# Patient Record
Sex: Male | Born: 1970 | Race: White | Hispanic: No | Marital: Married | Smoking: Never smoker
Health system: Southern US, Community
[De-identification: ages and names within clinical notes are randomized; demographics above are authoritative.]

## PROBLEM LIST (undated history)

## (undated) DIAGNOSIS — K219 Gastro-esophageal reflux disease without esophagitis: Secondary | ICD-10-CM

## (undated) DIAGNOSIS — I429 Cardiomyopathy, unspecified: Secondary | ICD-10-CM

## (undated) DIAGNOSIS — K589 Irritable bowel syndrome without diarrhea: Secondary | ICD-10-CM

## (undated) DIAGNOSIS — T7840XA Allergy, unspecified, initial encounter: Secondary | ICD-10-CM

## (undated) HISTORY — PX: TONSILLECTOMY: SUR1361

## (undated) HISTORY — PX: UPPER GASTROINTESTINAL ENDOSCOPY: SHX188

## (undated) HISTORY — DX: Irritable bowel syndrome, unspecified: K58.9

## (undated) HISTORY — DX: Gastro-esophageal reflux disease without esophagitis: K21.9

## (undated) HISTORY — PX: COLONOSCOPY: SHX174

## (undated) HISTORY — DX: Cardiomyopathy, unspecified: I42.9

## (undated) HISTORY — DX: Allergy, unspecified, initial encounter: T78.40XA

---

## 2006-08-26 ENCOUNTER — Encounter: Payer: Self-pay | Admitting: Internal Medicine

## 2006-08-26 ENCOUNTER — Ambulatory Visit: Payer: Self-pay

## 2008-11-09 ENCOUNTER — Ambulatory Visit: Payer: Self-pay | Admitting: Internal Medicine

## 2008-11-09 DIAGNOSIS — R197 Diarrhea, unspecified: Secondary | ICD-10-CM

## 2008-11-09 DIAGNOSIS — K219 Gastro-esophageal reflux disease without esophagitis: Secondary | ICD-10-CM | POA: Insufficient documentation

## 2008-11-09 LAB — CONVERTED CEMR LAB
Albumin: 4.3 g/dL (ref 3.5–5.2)
Alkaline Phosphatase: 62 units/L (ref 39–117)
Basophils Absolute: 0 10*3/uL (ref 0.0–0.1)
Creatinine, Ser: 1 mg/dL (ref 0.4–1.5)
Eosinophils Absolute: 0.1 10*3/uL (ref 0.0–0.7)
HCT: 44.6 % (ref 39.0–52.0)
Hemoglobin: 14.9 g/dL (ref 13.0–17.0)
IgA: 139 mg/dL (ref 68–378)
Lymphs Abs: 1.8 10*3/uL (ref 0.7–4.0)
MCHC: 33.3 g/dL (ref 30.0–36.0)
Monocytes Absolute: 0.3 10*3/uL (ref 0.1–1.0)
Neutro Abs: 4.3 10*3/uL (ref 1.4–7.7)
RDW: 12.4 % (ref 11.5–14.6)

## 2009-02-20 ENCOUNTER — Telehealth: Payer: Self-pay | Admitting: Internal Medicine

## 2009-02-20 DIAGNOSIS — I4949 Other premature depolarization: Secondary | ICD-10-CM

## 2009-02-20 DIAGNOSIS — R9389 Abnormal findings on diagnostic imaging of other specified body structures: Secondary | ICD-10-CM

## 2009-02-21 ENCOUNTER — Encounter (INDEPENDENT_AMBULATORY_CARE_PROVIDER_SITE_OTHER): Payer: Self-pay | Admitting: *Deleted

## 2009-03-15 ENCOUNTER — Ambulatory Visit: Payer: Self-pay | Admitting: Cardiology

## 2009-03-15 ENCOUNTER — Ambulatory Visit (HOSPITAL_COMMUNITY): Admission: RE | Admit: 2009-03-15 | Discharge: 2009-03-15 | Payer: Self-pay | Admitting: Family Medicine

## 2009-03-16 ENCOUNTER — Ambulatory Visit: Payer: Self-pay | Admitting: Internal Medicine

## 2009-03-16 LAB — CONVERTED CEMR LAB
ALT: 20 units/L (ref 0–53)
AST: 21 units/L (ref 0–37)
Albumin: 4.3 g/dL (ref 3.5–5.2)
Alkaline Phosphatase: 57 units/L (ref 39–117)
BUN: 12 mg/dL (ref 6–23)
CRP, High Sensitivity: 0.5 (ref 0.00–5.00)
Chloride: 106 meq/L (ref 96–112)
Creatinine, Ser: 1.1 mg/dL (ref 0.4–1.5)
GFR calc non Af Amer: 79.07 mL/min (ref >60)
Glucose, Bld: 87 mg/dL (ref 70–99)
Potassium: 4.2 meq/L (ref 3.5–5.1)
Total CHOL/HDL Ratio: 4
Triglycerides: 107 mg/dL (ref 0.0–149.0)

## 2009-04-19 ENCOUNTER — Ambulatory Visit: Payer: Self-pay | Admitting: Internal Medicine

## 2009-04-20 DIAGNOSIS — E785 Hyperlipidemia, unspecified: Secondary | ICD-10-CM

## 2010-03-10 ENCOUNTER — Encounter: Payer: Self-pay | Admitting: Internal Medicine

## 2010-03-20 NOTE — Progress Notes (Signed)
Summary: sch cardiac MRI  Phone Note Other Incoming   Summary of Call: Received mess from Dr Gala Romney that pt needs a cardiac MRI w/Dr Shirlee Latch for pvc's and abn echo, and pt also needs a f/u appt w/DB, will forward to [facility] to sch Initial call taken by: Meredith Staggers, RN,  February 20, 2009 9:04 AM  Follow-up for Phone Call        MRI sch for 1/27, pt aware Migdalia Dk  February 23, 2009 8:09 AM   New Problems: ECHOCARDIOGRAM, ABNORMAL (ICD-793.2) PREMATURE VENTRICULAR CONTRACTIONS (ICD-427.69)   New Problems: ECHOCARDIOGRAM, ABNORMAL (ICD-793.2) PREMATURE VENTRICULAR CONTRACTIONS (ICD-427.69)

## 2010-03-20 NOTE — Letter (Signed)
Summary: Appointment - Cardiac MRI  [facility], Main Office  [address]   [place], [place] [address]   Phone: [phone]  Fax: [phone]      February 21, 2009 MRN: [id number]   Billy Ferguson [address] [place], [place]  [address]   Dear [name],   We have scheduled the above patient for an appointment for a Cardiac MRI on 03/15/09 at 2:00pm.  Please refer to the below information for the location and instructions for this test:  Location:     [facility]       [address]       [place], [place]  [address] Instructions:    Arrive at [facility] Outpatient Registration 45 minutes prior to your appointment time.  This will ensure you are in the Radiology Department 30 minutes prior to your appointment.    There are no restrictions for this test you may eat and take medications as usual.  If you need to reschedule this appointment please call at the number listed above.  Sincerely,  Glass blower/designer

## 2010-03-20 NOTE — Assessment & Plan Note (Signed)
Summary: NP6   Visit Type:  Initial Consult Referring Provider:  n/a Primary Provider:  Martha Clan, MD   CC:  abnormal MRI.  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  As an attending in [place] was under a lot of stress. Had frequent palpitations. Holter was placed which showed lots of PVCs and couplets. No sustained arrhythmias. No syncope or presyncope. No HF. Signal averaged ECG was normal. Had echocardiogram which showed mildly reduced EF. This was apparently followed by a stress thallium with EF 48% and possibly a MUGA as well in 2008. Never had cath.  Eventually moved to [place]. When stress level improved palpitations resolved. He had echo here which showed mildly reduced to low normal EF so we followed with a cardiac MRI done in January 2011. EF 53%. Structurally normal.  aNormal volumes. No hyperenhancement. No infiltrative process.  Also has a h/o of hyperlipidemia with low HDL for which he does not take any meds. However, recent lipid panel looked good with HDL 42 and LDL 101 TG 107. hsCR-P low.Also reports a h/o of subclinical hyperthyroidism with a  TSH was unetectable. Now better.   Very active at work without symptoms but hard to find time to exercise between his job and family with 2 young kids.   Problems Prior to Update: 1)  Premature Ventricular Contractions  (ICD-427.69) 2)  Echocardiogram, Abnormal  (ICD-793.2) 3)  Gerd  (ICD-530.81) 4)  Perianal Nodule/?fistula  (ICD-793.4) 5)  Loose Stools  (ICD-787.91) 6)  Abdominal Bloating and Intestinal Gas  (ICD-787.3)  Medications Prior to Update: 1)  Prilosec Otc 20 Mg Tbec (Omeprazole Magnesium) .... One Tablet By Mouth Once Daily  Current Medications (verified): 1)  Prilosec Otc 20 Mg Tbec (Omeprazole Magnesium) .... One Tablet By Mouth Once Daily  Allergies (verified): 1)  ! * Eggs 2)  ! * Peanuts  Past  History:  Family History: Last updated: 11/09/2008 No FH of Colon Cancer: Family History of Stomach Cancer:Paternal Grandmother Family History of Colon Polyps:Dad   Social History: Last updated: 11/09/2008 Occupation: MD Critical Care Married 2 boys  Patient has never smoked.  Alcohol Use - no Illicit Drug Use - no  Risk Factors: Smoking Status: never (11/09/2008)  Past Medical History: 1. Anal Fissure 2. Hyperthyroidism (TSH undetectale but no clinical hyperthyroid) 3. Empty sella Syndrome 4. PREMATURE VENTRICULAR CONTRACTIONS   5. ECHOCARDIOGRAM, ABNORMAL   6. GERD  7. PERIANAL NODULE/?FISTULA   8. LOOSE STOOLS  9. ABDOMINAL BLOATING AND INTESTINAL GAS   Past Surgical History: Unremarkable Tonsillectomy  Family History: Reviewed history from 11/09/2008 and no changes required. No FH of Colon Cancer: Family History of Stomach Cancer:Paternal Grandmother Family History of Colon Polyps:Dad   Social History: Reviewed history from 11/09/2008 and no changes required. Occupation: MD Critical Care Married 2 boys  Patient has never smoked.  Alcohol Use - no Illicit Drug Use - no  Review of Systems       As per HPI and past medical history; otherwise all systems negative.   Vital Signs:  Patient profile:   40 year old male Height:      66.5 inches Weight:      151 pounds BMI:     24.09 Pulse rate:   75 / minute BP sitting:   104 / 68  (left arm) Cuff size:   regular  Vitals Entered By: Hardin Negus, RMA (April 19, 2009 10:46 AM)  Physical Exam  General:  Gen: well appearing. no resp difficulty HEENT: normal Neck: supple. no JVD. Carotids 2+ bilat; no bruits. No lymphadenopathy or thryomegaly appreciated. Cor: PMI nondisplaced. Regular rate & rhythm. No rubs, gallops, murmur. Lungs: clear Abdomen: soft, nontender, nondistended. Good bowel sounds. Extremities: no cyanosis, clubbing, rash, edema Neuro: alert & orientedx3, cranial nerves grossly  intact. moves all 4 extremities w/o difficulty. affect pleasant    Impression & Recommendations:  Problem # 1:  ECHOCARDIOGRAM, ABNORMAL (ICD-793.2) Dan's  EF is in the low-normal range and has been very stable. Based on the MRI, I think this is completely normal and EF is just on the low end of the bell curve. No further work-up needed currently. Would recheck echo in 1-2 years to f/u on stability.  Problem # 2:  PREMATURE VENTRICULAR CONTRACTIONS (ICD-427.69) Stress-mediated. Benign. Currently resolved.  Problem # 3:  HYPERLIPIDEMIA, WITH LOW HDL (ICD-272.4) LDL currently looks pretty good. Low-dose fish oil reasonable if he is interested. Given family history of CAD, starting ECASA 25 at age 53 not unreasonable. Discussed need and logisitcs of an aerobic exercise program.

## 2010-05-06 LAB — BASIC METABOLIC PANEL
CO2: 25 mEq/L (ref 19–32)
Calcium: 8.9 mg/dL (ref 8.4–10.5)
Chloride: 104 mEq/L (ref 96–112)
Potassium: 3.8 mEq/L (ref 3.5–5.1)
Sodium: 137 mEq/L (ref 135–145)

## 2012-04-15 ENCOUNTER — Other Ambulatory Visit: Payer: Self-pay | Admitting: Internal Medicine

## 2012-04-15 NOTE — Progress Notes (Signed)
Daughter has loose stools and C diff + Peds GI MD suggested he get tested for C diff

## 2012-04-20 ENCOUNTER — Other Ambulatory Visit: Payer: Self-pay

## 2012-04-22 LAB — CLOSTRIDIUM DIFFICILE BY PCR: Toxigenic C. Difficile by PCR: NOT DETECTED

## 2012-04-22 NOTE — Progress Notes (Signed)
Quick Note:  Patient aware. ______ 

## 2013-06-27 ENCOUNTER — Other Ambulatory Visit: Payer: Self-pay | Admitting: Internal Medicine

## 2013-06-27 DIAGNOSIS — Z91018 Allergy to other foods: Secondary | ICD-10-CM

## 2013-07-07 ENCOUNTER — Other Ambulatory Visit: Payer: Self-pay

## 2013-07-07 DIAGNOSIS — Z91018 Allergy to other foods: Secondary | ICD-10-CM

## 2013-07-08 LAB — NUT MIX PROFILE
Almonds: <0.1 kU/L
Brazil Nut: <0.1 kU/L
Peanut IgE: <0.1 kU/L
Pistachio  IgE: <0.1 kU/L
Walnut: <0.1 kU/L

## 2013-07-08 LAB — TISSUE TRANSGLUTAMINASE, IGA: Tissue Transglutaminase Ab, IgA: 2.7 U/mL (ref <20)

## 2013-07-08 LAB — RETICULIN ANTIBODIES, IGA W TITER: Reticulin Ab, IgA: NEGATIVE

## 2013-07-08 LAB — ALLERGEN FOOD PROFILE SPECIFIC IGE
Apple: <0.1 kU/L
Fish Cod: <0.1 kU/L
IgE (Immunoglobulin E), Serum: 129.1 IU/mL (ref 0.0–180.0)
Milk IgE: <0.1 kU/L
Orange: <0.1 kU/L
Peanut IgE: <0.1 kU/L
Tomato IgE: <0.1 kU/L
Tuna IgE: <0.1 kU/L
Wheat IgE: 0.19 kU/L — ABNORMAL HIGH

## 2013-07-08 LAB — GLIADIN ANTIBODIES, SERUM
Gliadin IgA: 9 U/mL (ref <20)
Gliadin IgG: 15.2 U/mL (ref <20)

## 2013-07-10 LAB — SOYBEAN IGE: Soybean IgE: <0.1 kU/L

## 2013-07-13 LAB — IGG FOOD PANEL
ALLERGEN WHEAT IGG: 0.44 ug/mL — AB (ref <0.15)
ALLERGEN, MILK, IGG: 16.8 ug/mL — AB (ref <0.15)
Beef, IgG: 6.4 ug/mL — ABNORMAL HIGH (ref <2.0)
Egg white, IgG: <2 ug/mL (ref <2.0)
Peanut, IgG: 0.21 ug/mL — ABNORMAL HIGH (ref <0.15)

## 2013-07-13 LAB — GALACTOSE-ALPHA-1,3-GALACTOSE IGE: Galactose-alpha-1,3-galactose IgE: <0.1 kU/L (ref <0.35)

## 2014-03-02 ENCOUNTER — Telehealth: Payer: Self-pay | Admitting: Internal Medicine

## 2014-03-02 NOTE — Telephone Encounter (Signed)
I will clarify his egg allergy also and how that impacts propofol use

## 2014-03-02 NOTE — Telephone Encounter (Signed)
We discussed needs for procedures  He needs an EGD because of chronic GERD abnd to screen for Barrett's  Needs colonoscopy screening due to family hx of colon cancer  Explained my RN will call Dr. Titus Mould on his cell and arrange for appointment in [facility] for these procedures - probably in March

## 2014-03-03 NOTE — Telephone Encounter (Signed)
Left message for patient to call back  

## 2014-03-03 NOTE — Telephone Encounter (Signed)
Patient wants to wait until April to schedule.  I will call him in April to set up.

## 2014-05-01 NOTE — Telephone Encounter (Signed)
Left message for patient to call back  

## 2014-05-04 NOTE — Telephone Encounter (Signed)
I sent the patient a staff message with date availabilities for procedures.  I will await his response

## 2015-04-30 ENCOUNTER — Telehealth: Payer: Self-pay

## 2015-04-30 NOTE — Telephone Encounter (Signed)
-----   Message from Gatha Mayer, MD sent at 04/13/2015 11:30 AM EST ----- Regarding: RE: May 11 appt Yes - I knew that the doc schedule just came out showing I scope that day and am anticipating so please just keep it on your list once May scheduling possible ----- Message -----    From: Marlon Pel, RN    Sent: 04/13/2015  11:12 AM      To: Gatha Mayer, MD Subject: RE: May 11 appt                                At this point will have to wait until May schedule comes out.  Is that oK? ----- Message -----    From: Gatha Mayer, MD    Sent: 04/13/2015   8:43 AM      To: Marlon Pel, RN Subject: May 11 appt                                    When you can please hold 0930 and 1000 slots for EGD and colonoscopy for Dr. Titus Mould   Dx: GERD and Family hx colon cancer and polyps

## 2015-04-30 NOTE — Telephone Encounter (Signed)
Patient is scheduled for pre-visit 05/31/15

## 2015-04-30 NOTE — Telephone Encounter (Signed)
Left message for patient to call back Patient is scheduled, but needs to come for pre-visit

## 2015-05-31 ENCOUNTER — Ambulatory Visit (AMBULATORY_SURGERY_CENTER): Payer: Self-pay | Admitting: *Deleted

## 2015-05-31 VITALS — Ht 66.0 in | Wt 137.0 lb

## 2015-05-31 DIAGNOSIS — Z800 Family history of malignant neoplasm of digestive organs: Secondary | ICD-10-CM

## 2015-05-31 DIAGNOSIS — K219 Gastro-esophageal reflux disease without esophagitis: Secondary | ICD-10-CM

## 2015-05-31 NOTE — Progress Notes (Signed)
No soy allergy known to patient -egss cause GI upset only like bloating diarrhea nothing anaphylactic  No issues with past sedation with any surgeries  or procedures, no intubation problems  No diet pills per patient No home 02 use per patient  No blood thinners per patient  Pt denies issues with constipation

## 2015-06-28 ENCOUNTER — Encounter: Payer: Self-pay | Admitting: Internal Medicine

## 2015-06-28 ENCOUNTER — Ambulatory Visit (AMBULATORY_SURGERY_CENTER): Payer: 59 | Admitting: Internal Medicine

## 2015-06-28 VITALS — BP 108/67 | HR 58 | Temp 97.8°F | Resp 12 | Ht 66.0 in | Wt 137.0 lb

## 2015-06-28 DIAGNOSIS — K219 Gastro-esophageal reflux disease without esophagitis: Secondary | ICD-10-CM

## 2015-06-28 DIAGNOSIS — R195 Other fecal abnormalities: Secondary | ICD-10-CM

## 2015-06-28 DIAGNOSIS — Z800 Family history of malignant neoplasm of digestive organs: Secondary | ICD-10-CM

## 2015-06-28 DIAGNOSIS — K589 Irritable bowel syndrome without diarrhea: Secondary | ICD-10-CM | POA: Diagnosis not present

## 2015-06-28 DIAGNOSIS — Z1211 Encounter for screening for malignant neoplasm of colon: Secondary | ICD-10-CM | POA: Diagnosis not present

## 2015-06-28 MED ORDER — SODIUM CHLORIDE 0.9 % IV SOLN: 500.0000 mL | INTRAVENOUS | Status: DC

## 2015-06-28 NOTE — Op Note (Signed)
[place] Patient Name: Billy Ferguson Procedure Date: 06/28/2015 9:16 AM MRN: [id number] Endoscopist: Gatha Mayer , MD Age: 45 Date of Birth: 02/01/1971 Gender: Male Procedure:                Colonoscopy Indications:              Screening for colon cancer: Family history of colon                            polyps in distant relative(s) before age 57,                            Screening for colon cancer: Family history of                            colorectal cancer in distant relative(s) before age                            72 Medicines:                Propofol per Anesthesia, Monitored Anesthesia Care Procedure:                Pre-Anesthesia Assessment:                           - Prior to the procedure, a History and Physical                            was performed, and patient medications and                            allergies were reviewed. The patient's tolerance of                            previous anesthesia was also reviewed. The risks                            and benefits of the procedure and the sedation                            options and risks were discussed with the patient.                            All questions were answered, and informed consent                            was obtained. Prior Anticoagulants: The patient has                            taken no previous anticoagulant or antiplatelet                            agents. ASA Grade Assessment: II - A patient with  mild systemic disease. After reviewing the risks                            and benefits, the patient was deemed in                            satisfactory condition to undergo the procedure.                           After obtaining informed consent, the colonoscope                            was passed under direct vision. Throughout the                            procedure, the patient's blood pressure, pulse, and    oxygen saturations were monitored continuously. The                            Model CF-HQ190L [id number]) scope was introduced                            through the anus and advanced to the the terminal                            ileum, with identification of the appendiceal                            orifice and IC valve. The colonoscopy was performed                            without difficulty. The patient tolerated the                            procedure well. The bowel preparation used was                            Miralax. The quality of the bowel preparation was                            excellent. The terminal ileum, ileocecal valve,                            appendiceal orifice, and rectum were photographed. Scope In: 9:42:25 AM Scope Out: 9:53:48 AM Scope Withdrawal Time: 0 hours 8 minutes 44 seconds  Total Procedure Duration: 0 hours 11 minutes 23 seconds  Findings:                 The perianal exam was abnormal.                           The terminal ileum appeared normal.                           The colon (entire examined portion)  appeared                            normal. Biopsies for histology were taken with a                            cold forceps from the entire colon for evaluation                            of microscopic colitis. Verification of patient                            identification for the specimen was done. Estimated                            blood loss was minimal.                           The digital rectal exam was normal. Pertinent                            negatives include normal prostate (size, shape, and                            consistency). Complications:            No immediate complications. Estimated Blood Loss:     Estimated blood loss was minimal. Impression:               - Abnormal perianal exam.                           - The examined portion of the ileum was normal.                           - The entire examined  colon is normal. Biopsied. Recommendation:           - Patient has a contact number available for                            emergencies. The signs and symptoms of potential                            delayed complications were discussed with the                            patient. Return to normal activities tomorrow.                            Written discharge instructions were provided to the                            patient.                           - Resume previous diet.                           -  Continue present medications.                           - Repeat colonoscopy in 5 years for screening                            purposes. Gatha Mayer, MD 06/28/2015 10:15:53 AM This report has been signed electronically. CC Letter to:             Lendon Ka Brigitte Pulse, MD

## 2015-06-28 NOTE — Progress Notes (Signed)
A and Ox 3 Report to RN 

## 2015-06-28 NOTE — Progress Notes (Signed)
Called to room to assist during endoscopic procedure.  Patient ID and intended procedure confirmed with present staff. Received instructions for my participation in the procedure from the performing physician.  

## 2015-06-28 NOTE — Op Note (Signed)
[place] Patient Name: Billy Ferguson Procedure Date: 06/28/2015 9:20 AM MRN: [id number] Endoscopist: Gatha Mayer , MD Age: 45 Date of Birth: 03-04-1970 Gender: Male Procedure:                Upper GI endoscopy Indications:              Screening for Barrett's esophagus in patient at                            risk for this condition, Heartburn, Esophageal                            reflux Medicines:                Propofol per Anesthesia, Monitored Anesthesia Care Procedure:                Pre-Anesthesia Assessment:                           - Prior to the procedure, a History and Physical                            was performed, and patient medications and                            allergies were reviewed. The patient's tolerance of                            previous anesthesia was also reviewed. The risks                            and benefits of the procedure and the sedation                            options and risks were discussed with the patient.                            All questions were answered, and informed consent                            was obtained. Prior Anticoagulants: The patient has                            taken no previous anticoagulant or antiplatelet                            agents. ASA Grade Assessment: II - A patient with                            mild systemic disease. After reviewing the risks                            and benefits, the patient was deemed in  satisfactory condition to undergo the procedure.                           After obtaining informed consent, the endoscope was                            passed under direct vision. Throughout the                            procedure, the patient's blood pressure, pulse, and                            oxygen saturations were monitored continuously. The                            Model GIF-HQ190 [id number]) scope was introduced               through the mouth, and advanced to the second part                            of duodenum. The upper GI endoscopy was                            accomplished without difficulty. The patient                            tolerated the procedure well. Scope In: Scope Out: Findings:                 The esophagus was normal.                           The stomach was normal.                           The examined duodenum was normal.                           The cardia and gastric fundus were normal on                            retroflexion. Complications:            No immediate complications. Estimated Blood Loss:     Estimated blood loss: none. Impression:               - Normal esophagus.                           - Normal stomach.                           - Normal examined duodenum.                           - No specimens collected. Recommendation:           - Patient has a contact number available for  emergencies. The signs and symptoms of potential                            delayed complications were discussed with the                            patient. Return to normal activities tomorrow.                            Written discharge instructions were provided to the                            patient.                           - Resume previous diet.                           - Continue present medications.                           - See the other procedure note for documentation of                            additional recommendations. Gatha Mayer, MD 06/28/2015 10:08:41 AM This report has been signed electronically. CC Letter to:             Lendon Ka Brigitte Pulse, MD

## 2015-06-28 NOTE — Patient Instructions (Addendum)
EGD - Normal!  Colonoscopy and ileoscopy - Normal! - Colon biopsies taken.  Will call biopsy results - probably repeat colonoscopy 2022.  I appreciate the opportunity to care for you. Gatha Mayer, MD, FACG  YOU HAD AN ENDOSCOPIC PROCEDURE TODAY AT [facility]:   Refer to the procedure report that was given to you for any specific questions about what was found during the examination.  If the procedure report does not answer your questions, please call your gastroenterologist to clarify.  If you requested that your care partner not be given the details of your procedure findings, then the procedure report has been included in a sealed envelope for you to review at your convenience later.  YOU SHOULD EXPECT: Some feelings of bloating in the abdomen. Passage of more gas than usual.  Walking can help get rid of the air that was put into your GI tract during the procedure and reduce the bloating. If you had a lower endoscopy (such as a colonoscopy or flexible sigmoidoscopy) you may notice spotting of blood in your stool or on the toilet paper. If you underwent a bowel prep for your procedure, you may not have a normal bowel movement for a few days.  Please Note:  You might notice some irritation and congestion in your nose or some drainage.  This is from the oxygen used during your procedure.  There is no need for concern and it should clear up in a day or so.  SYMPTOMS TO REPORT IMMEDIATELY:   Following lower endoscopy (colonoscopy or flexible sigmoidoscopy):  Excessive amounts of blood in the stool  Significant tenderness or worsening of abdominal pains  Swelling of the abdomen that is new, acute  Fever of 100F or higher   Following upper endoscopy (EGD)  Vomiting of blood or coffee ground material  New chest pain or pain under the shoulder blades  Painful or persistently difficult swallowing  New shortness of breath  Fever of 100F or higher  Black,  tarry-looking stools  For urgent or emergent issues, a gastroenterologist can be reached at any hour by calling [phone].   DIET: Your first meal following the procedure should be a small meal and then it is ok to progress to your normal diet. Heavy or fried foods are harder to digest and may make you feel nauseous or bloated.  Likewise, meals heavy in dairy and vegetables can increase bloating.  Drink plenty of fluids but you should avoid alcoholic beverages for 24 hours.  ACTIVITY:  You should plan to take it easy for the rest of today and you should NOT DRIVE or use heavy machinery until tomorrow (because of the sedation medicines used during the test).    FOLLOW UP: Our staff will call the number listed on your records the next business day following your procedure to check on you and address any questions or concerns that you may have regarding the information given to you following your procedure. If we do not reach you, we will leave a message.  However, if you are feeling well and you are not experiencing any problems, there is no need to return our call.  We will assume that you have returned to your regular daily activities without incident.  If any biopsies were taken you will be contacted by phone or by letter within the next 1-3 weeks.  Please call us at [phone] if you have not heard about the biopsies in 3 weeks.  SIGNATURES/CONFIDENTIALITY: You and/or your care partner have signed paperwork which will be entered into your electronic medical record.  These signatures attest to the fact that that the information above on your After Visit Summary has been reviewed and is understood.  Full responsibility of the confidentiality of this discharge information lies with you and/or your care-partner.

## 2015-06-29 ENCOUNTER — Telehealth: Payer: Self-pay | Admitting: *Deleted

## 2015-06-29 NOTE — Telephone Encounter (Signed)
  Follow up Call-  Call back number 06/28/2015  Post procedure Call Back phone  # [phone]  Permission to leave phone message Yes     Patient questions:  Do you have a fever, pain , or abdominal swelling? No. Pain Score  0 *  Have you tolerated food without any problems? Yes.    Have you been able to return to your normal activities? Yes.    Do you have any questions about your discharge instructions: Diet   No. Medications  No. Follow up visit  No.  Do you have questions or concerns about your Care? No.  Actions: * If pain score is 4 or above: No action needed, pain <4.

## 2015-07-04 NOTE — Progress Notes (Signed)
Quick Note:  Normal biopsies Communicated to patient by phone 5 yr colon recall No letter ______

## 2015-11-08 DIAGNOSIS — Z302 Encounter for sterilization: Secondary | ICD-10-CM | POA: Diagnosis not present

## 2016-01-29 DIAGNOSIS — L821 Other seborrheic keratosis: Secondary | ICD-10-CM | POA: Diagnosis not present

## 2016-01-29 DIAGNOSIS — D2261 Melanocytic nevi of right upper limb, including shoulder: Secondary | ICD-10-CM | POA: Diagnosis not present

## 2016-01-29 DIAGNOSIS — D2372 Other benign neoplasm of skin of left lower limb, including hip: Secondary | ICD-10-CM | POA: Diagnosis not present

## 2016-01-29 DIAGNOSIS — Z23 Encounter for immunization: Secondary | ICD-10-CM | POA: Diagnosis not present

## 2016-01-29 DIAGNOSIS — D225 Melanocytic nevi of trunk: Secondary | ICD-10-CM | POA: Diagnosis not present

## 2016-05-26 ENCOUNTER — Ambulatory Visit (INDEPENDENT_AMBULATORY_CARE_PROVIDER_SITE_OTHER): Payer: 59 | Admitting: Neurology

## 2016-05-26 ENCOUNTER — Encounter (INDEPENDENT_AMBULATORY_CARE_PROVIDER_SITE_OTHER): Payer: Self-pay

## 2016-05-26 ENCOUNTER — Encounter: Payer: Self-pay | Admitting: Neurology

## 2016-05-26 DIAGNOSIS — G43109 Migraine with aura, not intractable, without status migrainosus: Secondary | ICD-10-CM

## 2016-05-26 DIAGNOSIS — G43909 Migraine, unspecified, not intractable, without status migrainosus: Secondary | ICD-10-CM | POA: Insufficient documentation

## 2016-05-26 MED ORDER — SUMATRIPTAN SUCCINATE 100 MG PO TABS: 100.0000 mg | ORAL_TABLET | Freq: Once | ORAL | 12 refills | Status: DC | PRN

## 2016-05-26 NOTE — Patient Instructions (Addendum)
Overall you are doing Sumatriptan tablets What is this medicine? SUMATRIPTAN (soo ma TRIP tan) is used to treat migraines with or without aura. An aura is a strange feeling or visual disturbance that warns you of an attack. It is not used to prevent migraines. This medicine may be used for other purposes; ask your health care provider or pharmacist if you have questions. COMMON BRAND NAME(S): Imitrex, Migraine Pack What should I tell my health care provider before I take this medicine? They need to know if you have any of these conditions: -circulation problems in fingers and toes -diabetes -heart disease -high blood pressure -high cholesterol -history of irregular heartbeat -history of stroke -kidney disease -liver disease -postmenopausal or surgical removal of uterus and ovaries -seizures -smoke tobacco -stomach or intestine problems -an unusual or allergic reaction to sumatriptan, other medicines, foods, dyes, or preservatives -pregnant or trying to get pregnant -breast-feeding How should I use this medicine? Take this medicine by mouth with a glass of water. Follow the directions on the prescription label. This medicine is taken at the first symptoms of a migraine. It is not for everyday use. If your migraine headache returns after one dose, you can take another dose as directed. You must leave at least 2 hours between doses, and do not take more than 100 mg as a single dose. Do not take more than 200 mg total in any 24 hour period. If there is no improvement at all after the first dose, do not take a second dose without talking to your doctor or health care professional. Do not take your medicine more often than directed. Talk to your pediatrician regarding the use of this medicine in children. Special care may be needed. Overdosage: If you think you have taken too much of this medicine contact a poison control center or emergency room at once. NOTE: This medicine is only for you. Do  not share this medicine with others. What if I miss a dose? This does not apply; this medicine is not for regular use. What may interact with this medicine? Do not take this medicine with any of the following medicines: -cocaine -ergot alkaloids like dihydroergotamine, ergonovine, ergotamine, methylergonovine -feverfew -MAOIs like Carbex, Eldepryl, Marplan, Nardil, and Parnate -other medicines for migraine headache like almotriptan, eletriptan, frovatriptan, naratriptan, rizatriptan, zolmitriptan -tryptophan This medicine may also interact with the following medications: -certain medicines for depression, anxiety, or psychotic disturbances This list may not describe all possible interactions. Give your health care provider a list of all the medicines, herbs, non-prescription drugs, or dietary supplements you use. Also tell them if you smoke, drink alcohol, or use illegal drugs. Some items may interact with your medicine. What should I watch for while using this medicine? Only take this medicine for a migraine headache. Take it if you get warning symptoms or at the start of a migraine attack. It is not for regular use to prevent migraine attacks. You may get drowsy or dizzy. Do not drive, use machinery, or do anything that needs mental alertness until you know how this medicine affects you. To reduce dizzy or fainting spells, do not sit or stand up quickly, especially if you are an older patient. Alcohol can increase drowsiness, dizziness and flushing. Avoid alcoholic drinks. Smoking cigarettes may increase the risk of heart-related side effects from using this medicine. If you take migraine medicines for 10 or more days a month, your migraines may get worse. Keep a diary of headache days and medicine use. Contact your  healthcare professional if your migraine attacks occur more frequently. What side effects may I notice from receiving this medicine? Side effects that you should report to your  doctor or health care professional as soon as possible: -allergic reactions like skin rash, itching or hives, swelling of the face, lips, or tongue -bloody or watery diarrhea -hallucination, loss of contact with reality -pain, tingling, numbness in the face, hands, or feet -seizures -signs and symptoms of a blood clot such as breathing problems; changes in vision; chest pain; severe, sudden headache; pain, swelling, warmth in the leg; trouble speaking; sudden numbness or weakness of the face, arm, or leg -signs and symptoms of a dangerous change in heartbeat or heart rhythm like chest pain; dizziness; fast or irregular heartbeat; palpitations, feeling faint or lightheaded; falls; breathing problems -signs and symptoms of a stroke like changes in vision; confusion; trouble speaking or understanding; severe headaches; sudden numbness or weakness of the face, arm, or leg; trouble walking; dizziness; loss of balance or coordination -stomach pain Side effects that usually do not require medical attention (report to your doctor or health care professional if they continue or are bothersome): -changes in taste -facial flushing -headache -muscle cramps -muscle pain -nausea, vomiting -weak or tired This list may not describe all possible side effects. Call your doctor for medical advice about side effects. You may report side effects to FDA at 1-800-FDA-1088. Where should I keep my medicine? Keep out of the reach of children. Store at room temperature between 2 and 30 degrees C (36 and 86 degrees F). Throw away any unused medicine after the expiration date. NOTE: This sheet is a summary. It may not cover all possible information. If you have questions about this medicine, talk to your doctor, pharmacist, or health care provider.  2018 Elsevier/Gold Standard (2015-03-08 12:38:23) well but I do want to suggest a few things today:   Remember to drink plenty of fluid, eat healthy meals and do not skip  any meals. Try to eat protein with a every meal and eat a healthy snack such as fruit or nuts in between meals. Try to keep a regular sleep-wake schedule and try to exercise daily, particularly in the form of walking, 20-30 minutes a day, if you can.   As far as your medications are concerned, I would like to suggest: Imitrex at onset. Please take one tablet at the onset of your headache. If it does not improve the symptoms please take one additional tablet in 2 hours. Do not take more then 2 tablets in 24hrs. Do not take use more then 2 to 3 times in a week. Can take with Cambia, ibuprofen or tylenol.  Our phone number is [phone]. We also have an after hours call service for urgent matters and there is a physician on-call for urgent questions. For any emergencies you know to call 911 or go to the nearest emergency room

## 2016-05-26 NOTE — Progress Notes (Signed)
[facility]    Provider:  Dr Jaynee Eagles Referring Provider: Marton Redwood, MD Primary Care Physician:  Marton Redwood, MD  CC:  Headaches  HPI:  Billy Ferguson is a 46 y.o. male here as a referral from Dr. Brigitte Pulse for headaches. 10 months ago he was in a meeting and he has lights that looked like diamonds and in 30 minutes spread to the other eye followed by a severe headache. Son with migraines. Never had a migraine before. Nothing helped. The headache was frontal and top, couldn't function for 2 hours later, it lasted for 7 hours. No nausea, no vomiting. He had not eaten well that day and his sleeping was poor that day. 6 months later he had the same episode and took tylenol and ibuprofen immediately. He describes it as pounding and thrbbing. Bilaterally in the eyes. He couldn;t see half of people's face. He had light and sound sensitivity, he had to turn off all his lights. Wine and steak can give him headaches. He gets a headache once a week. Especially when he gets upset. Temporal headache. Right-sided predominant. Pounding. Headaches since the 30s.  No other focal neurologic deficits, associated symptoms, inciting events or modifiable factors.  Review of Systems: Patient complains of symptoms per HPI as well as the following symptoms: no CP, no SOB. Pertinent negatives per HPI. All others negative.   Social History   Social History  . Marital status: Married    Spouse name: N/A  . Number of children: 3  . Years of education: MD   Occupational History  . MD    Social History Main Topics  . Smoking status: Never Smoker  . Smokeless tobacco: Never Used  . Alcohol use 0.0 oz/week     Comment: rare   . Drug use: No  . Sexual activity: Not on file   Other Topics Concern  . Not on file   Social History Narrative   Lives at home w/ wife and children   Right-handed   Caffeine: rare    Family History  Problem Relation Age of Onset  . Colon polyps Father   .  Heart disease Father   . Colon cancer Paternal Grandmother   . Prostate cancer Paternal Grandfather   . High Cholesterol Mother   . Rectal cancer Neg Hx   . Stomach cancer Neg Hx     Past Medical History:  Diagnosis Date  . Allergy   . GERD (gastroesophageal reflux disease)   . IBS (irritable bowel syndrome)     Past Surgical History:  Procedure Laterality Date  . COLONOSCOPY    . TONSILLECTOMY     age 74   . UPPER GASTROINTESTINAL ENDOSCOPY      Current Outpatient Prescriptions  Medication Sig Dispense Refill  . fexofenadine (ALLEGRA) 180 MG tablet Take 180 mg by mouth daily. As needed    . omeprazole (PRILOSEC) 20 MG capsule Take 20 mg by mouth daily.    . SUMAtriptan (IMITREX) 100 MG tablet Take 1 tablet (100 mg total) by mouth once as needed. May repeat in 2 hours if headache persists or recurs. 10 tablet 12   No current facility-administered medications for this visit.     Allergies as of 05/26/2016 - Review Complete 05/26/2016  Allergen Reaction Noted  . Eggs or egg-derived products Diarrhea   . Peanut-containing drug products    . Wheat bran Diarrhea 05/31/2015    Vitals: BP 107/69   Pulse 68   Ht 5' 6.5" (  1.689 m)   Wt 135 lb 12.8 oz (61.6 kg)   BMI 21.59 kg/m  Last Weight:  Wt Readings from Last 1 Encounters:  05/26/16 135 lb 12.8 oz (61.6 kg)   Last Height:   Ht Readings from Last 1 Encounters:  05/26/16 5' 6.5" (1.689 m)   Physical exam: Exam: Gen: NAD, conversant, well nourised, thin, well groomed                     CV: RRR, no MRG. No Carotid Bruits. No peripheral edema, warm, nontender Eyes: Conjunctivae clear without exudates or hemorrhage  Neuro: Detailed Neurologic Exam  Speech:    Speech is normal; fluent and spontaneous with normal comprehension.  Cognition:    The patient is oriented to person, place, and time;     recent and remote memory intact;     language fluent;     normal attention, concentration,     fund of  knowledge Cranial Nerves:    The pupils are equal, round, and reactive to light. The fundi are normal and spontaneous venous pulsations are present. Visual fields are full to finger confrontation. Extraocular movements are intact. Trigeminal sensation is intact and the muscles of mastication are normal. The face is symmetric. The palate elevates in the midline. Hearing intact. Voice is normal. Shoulder shrug is normal. The tongue has normal motion without fasciculations.   Coordination:    Normal finger to nose and heel to shin. Normal rapid alternating movements.   Gait:    Heel-toe and tandem gait are normal.   Motor Observation:    No asymmetry, no atrophy, and no involuntary movements noted. Tone:    Normal muscle tone.    Posture:    Posture is normal. normal erect    Strength:    Strength is V/V in the upper and lower limbs.      Sensation: intact to LT     Reflex Exam:  DTR's:    Deep tendon reflexes in the upper and lower extremities are normal bilaterally.   Toes:    The toes are downgoing bilaterally.   Clonus:    Clonus is absent.       Assessment/Plan:  46 year old patient with migraines with and without aura  -Remember to drink plenty of fluid, eat healthy meals and do not skip any meals. Try to eat protein with a every meal and eat a healthy snack such as fruit or nuts in between meals. Try to keep a regular sleep-wake schedule and try to exercise daily, particularly in the form of walking, 20-30 minutes a day, if you can.   As far as your medications are concerned, I would like to suggest: Imitrex at onset. Please take one tablet at the onset of your headache. If it does not improve the symptoms please take one additional tablet in 2 hours. Do not take more then 2 tablets in 24hrs. Do not take use more then 2 to 3 times in a week. Can take with Cambia, ibuprofen or tylenol.  - Discussed increased risk of stroke in people with migraine with aura  - discussed:  To prevent or relieve headaches, try the following: Cool Compress. Lie down and place a cool compress on your head.  Avoid headache triggers. If certain foods or odors seem to have triggered your migraines in the past, avoid them. A headache diary might help you identify triggers.  Include physical activity in your daily routine. Try a daily walk  or other moderate aerobic exercise.  Manage stress. Find healthy ways to cope with the stressors, such as delegating tasks on your to-do list.  Practice relaxation techniques. Try deep breathing, yoga, massage and visualization.  Eat regularly. Eating regularly scheduled meals and maintaining a healthy diet might help prevent headaches. Also, drink plenty of fluids.  Follow a regular sleep schedule. Sleep deprivation might contribute to headaches Consider biofeedback. With this mind-body technique, you learn to control certain bodily functions - such as muscle tension, heart rate and blood pressure - to prevent headaches or reduce headache pain.    Proceed to emergency room if you experience new or worsening symptoms or symptoms do not resolve, if you have new neurologic symptoms or if headache is severe, or for any concerning symptom.    Sarina Ill, MD  [facility] [address] [place], [place] [address]  Phone [phone] Fax [phone]  A total of 45 minutes was spent face-to-face with this patient. Over half this time was spent on counseling patient on the migraine diagnosis and different diagnostic and therapeutic options available.

## 2016-07-02 DIAGNOSIS — M545 Low back pain: Secondary | ICD-10-CM | POA: Diagnosis not present

## 2016-07-02 DIAGNOSIS — M549 Dorsalgia, unspecified: Secondary | ICD-10-CM | POA: Diagnosis not present

## 2016-07-08 ENCOUNTER — Ambulatory Visit: Payer: 59 | Attending: Neurosurgery

## 2016-07-08 DIAGNOSIS — M2560 Stiffness of unspecified joint, not elsewhere classified: Secondary | ICD-10-CM

## 2016-07-08 DIAGNOSIS — R293 Abnormal posture: Secondary | ICD-10-CM | POA: Diagnosis not present

## 2016-07-08 DIAGNOSIS — M6281 Muscle weakness (generalized): Secondary | ICD-10-CM

## 2016-07-08 DIAGNOSIS — M545 Low back pain, unspecified: Secondary | ICD-10-CM

## 2016-07-08 DIAGNOSIS — M62830 Muscle spasm of back: Secondary | ICD-10-CM | POA: Diagnosis not present

## 2016-07-08 DIAGNOSIS — M25652 Stiffness of left hip, not elsewhere classified: Secondary | ICD-10-CM | POA: Diagnosis not present

## 2016-07-08 DIAGNOSIS — M25651 Stiffness of right hip, not elsewhere classified: Secondary | ICD-10-CM | POA: Diagnosis not present

## 2016-07-08 NOTE — Therapy (Addendum)
[place], [place], [address] Phone: [phone]   Fax:  [phone]  Physical Therapy Evaluation/ Discharge  Patient Details  Name: Billy Ferguson MRN: [id number] Date of Birth: 07-Apr-1970 No Data Recorded  Encounter Date: 07/08/2016      PT End of Session - 07/08/16 1444    Visit Number 1   Number of Visits 5   Date for PT Re-Evaluation 09/05/16   Authorization Type UMR   PT Start Time 0245   PT Stop Time 0330   PT Time Calculation (min) 45 min   Activity Tolerance Patient tolerated treatment well;No increased pain   Behavior During Therapy WFL for tasks assessed/performed      Past Medical History:  Diagnosis Date  . Allergy   . GERD (gastroesophageal reflux disease)   . IBS (irritable bowel syndrome)     Past Surgical History:  Procedure Laterality Date  . COLONOSCOPY    . TONSILLECTOMY     age 46   . UPPER GASTROINTESTINAL ENDOSCOPY      There were no vitals filed for this visit.       Subjective Assessment - 07/08/16 1446    Subjective He reports a month ago He liftied heavy water jugs and had pain. History in past. After day 4 after this incident he began to have LT sided back pain into buttock Pain lingered with limited activity.  he runs and is active. Slowly improving  Standing better , sitting  worse.    Limitations Sitting;House hold activities  getting ouut of car moveing leg abucted from sitting.    Diagnostic tests xray negative.             [facility] PT Assessment - 07/08/16 0001      Assessment   Onset Date/Surgical Date --  3-4 weeks ago.    Prior Therapy No     Precautions   Precautions None     Restrictions   Weight Bearing Restrictions No     Balance Screen   Has the patient fallen in the past 6 months No     Prior Function   Level of Independence Independent   Vocation Full time employment   Vocation Requirements MD     Cognition   Overall  Cognitive Status Within Functional Limits for tasks assessed     Posture/Postural Control   Posture Comments LAterla shift to LT       ROM / Strength   AROM / PROM / Strength AROM;Strength;PROM     AROM   AROM Assessment Site Lumbar   Lumbar Flexion 45   Lumbar Extension 20   Lumbar - Right Side Bend 20   Lumbar - Left Side Bend 15     Flexibility   Soft Tissue Assessment /Muscle Length yes                           PT Education - 07/08/16 1601    Education provided Yes   Education Details POC HEP   modify or stop exrcise if painful   Person(s) Educated Patient   Methods Explanation;Demonstration;Verbal cues;Handout   Comprehension Returned demonstration;Verbalized understanding             PT Long Term Goals - 07/08/16 1555      PT LONG TERM GOAL #1   Title He will be independent with all HEP   Time 8   Period Weeks   Status  New     PT LONG TERM GOAL #2   Title He will return to running  wihtou pain 2-3 miles   Time 8   Period Weeks   Status New     PT LONG TERM GOAL #3   Title He will report stiffness decreased 80% or more in AM   Time 8   Period Weeks   Status New     PT LONG TERM GOAL #4   Title He will be able to lift in each hand 20 pounds without pain   Time 8   Period Weeks   Status New               Plan - 07/08/16 1444    Clinical Impression Statement 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  He was tneder to palapation LT L4 area and pain was elicited with LT sidebending and rotation /extension  to Lt but not to RT.  He has Scoliosis /LT lateral shift with RT shoulder lower than LT.    He demo weakness in both hip extension and abduction .    and fair abdominal strength.   It apears he may have had a facet joint irritation that is improveing and should resolve he would do well to stretch on regular basis and improve hip and core strength.  Also to progress to running in  deliberate step by step fashion with incremental increases in time or distance.      Will progress HEP as needed  so I set a long duration only if needed . May discharge sooner.    Rehab Potential Good   PT Frequency 2x / week   PT Duration 4 weeks   PT Treatment/Interventions Electrical Stimulation;Moist Heat;Cryotherapy;Dry needling;Manual techniques;Therapeutic exercise;Patient/family education;Therapeutic activities;Passive range of motion   PT Next Visit Plan Review HEP. Add rotation stretching  (Bretzel ?) and more advanced hip strength   PT Home Exercise Plan Williams exercises , hamstring stretching, SLR side and prone   Consulted and Agree with Plan of Care Patient      Patient will benefit from skilled therapeutic intervention in order to improve the following deficits and impairments:  Pain, Postural dysfunction, Decreased range of motion, Decreased activity tolerance  Visit Diagnosis: Acute left-sided low back pain without sciatica  Abnormal posture  Muscle spasm of back  Joint stiffness of both hips  Muscle weakness (generalized)  Joint stiffness of spine     Problem List Patient Active Problem List   Diagnosis Date Noted  . Migraine 05/26/2016  . Family history of colon cancer - suspected in paternal grandmother died at 66, father with polyps 06/28/2015  . HYPERLIPIDEMIA, WITH LOW HDL 04/20/2009  . PREMATURE VENTRICULAR CONTRACTIONS 02/20/2009  . ECHOCARDIOGRAM, ABNORMAL 02/20/2009  . GERD 11/09/2008  . LOOSE STOOLS 11/09/2008    Darrel Hoover   PT 07/08/2016, 4:04 PM  [facility] [address] [place], [place], [address] Phone: [phone]   Fax:  [phone]  Name: Billy Ferguson MRN: [id number] Date of Birth: September 28, 1970 PHYSICAL THERAPY DISCHARGE SUMMARY  Visits from Start of Care: Eval only  Current functional level related to goals / functional outcomes: Unknown as he canceled followup  appointment and did not reschedule   Remaining deficits: Unknown   Education / Equipment: HEP  Plan: Patient agrees to discharge.  Patient goals were not met. Patient is being discharged due to not returning since the last visit.  ?????  Noralee Stain PT  09/08/16  2:37 PM

## 2016-07-08 NOTE — Patient Instructions (Signed)
From cabinet SLR ext and abduction  10-25 reps daily, williams exercise  Daily 30 sec stretch  10 sec hold with PPT  10-15 reps, hamstring stretching 2-3 reps 30 sec or more

## 2016-07-16 ENCOUNTER — Encounter: Payer: Self-pay | Admitting: Sports Medicine

## 2016-07-16 ENCOUNTER — Ambulatory Visit: Payer: Self-pay

## 2016-07-16 ENCOUNTER — Ambulatory Visit (INDEPENDENT_AMBULATORY_CARE_PROVIDER_SITE_OTHER): Payer: 59 | Admitting: Sports Medicine

## 2016-07-16 VITALS — BP 110/70 | HR 80 | Ht 66.5 in | Wt 136.6 lb

## 2016-07-16 DIAGNOSIS — M25561 Pain in right knee: Secondary | ICD-10-CM | POA: Diagnosis not present

## 2016-07-16 DIAGNOSIS — M7631 Iliotibial band syndrome, right leg: Secondary | ICD-10-CM

## 2016-07-16 MED ORDER — DICLOFENAC SODIUM 2 % TD SOLN: 1.0000 "application " | Freq: Two times a day (BID) | TRANSDERMAL | 2 refills | Status: DC

## 2016-07-16 MED ORDER — DICLOFENAC SODIUM 2 % TD SOLN: 1.0000 "application " | Freq: Two times a day (BID) | TRANSDERMAL | 0 refills | Status: AC

## 2016-07-16 NOTE — Progress Notes (Signed)
OFFICE VISIT NOTE Billy Ferguson. Anvitha Hutmacher, [place] at [place]  TERRE ZABRISKIE - 46 y.o. male MRN [id number]  Date of birth: [birth year]  Visit Date: 07/16/2016  PCP: Marton Redwood, MD   Referred by: Marton Redwood, MD  Burlene Arnt, CMA acting as scribe for Dr. Paulla Fore.  SUBJECTIVE:   Chief Complaint  Patient presents with  . Pain in right knee   HPI: As below and per problem based documentation when appropriate.  Pt presents today with complaint of right knee pain. Pain occurs on impact on the lateral aspect of the knee.  Pain started about 2 months ago.  Pt started running after taking the winter off. Within the first mile his knee began to hurt.   The pain is described as sharp pain that radiates behind the knee and is rated as 7/10 at its worst.  Worsened with running. The pain starts when running and gets worse the further he goes. He doesn't have much trouble when speed walking. The pain seems to resolve after a day or two. He has some pain when going up and down stairs. The knee is not tender to palpation, no swelling.  Improves if pt stops running.  Therapies tried include : Pt has not tried icing or using heat therapy. He has tried Ibuprofen for his back pain.   Other associated symptoms include: No pain in the hip, ankle, foot, no pain in upper or lower leg.   Pt denies fever, chills, night sweats, no unitentional weight loss or weight gain.     Review of Systems  Constitutional: Negative for chills and fever.  Respiratory: Negative for shortness of breath and wheezing.   Cardiovascular: Negative for chest pain, palpitations and leg swelling.  Musculoskeletal: Positive for joint pain. Negative for falls.  Neurological: Negative for dizziness, tingling and headaches.  Endo/Heme/Allergies: Does not bruise/bleed easily.    Otherwise per HPI.  HISTORY & PERTINENT PRIOR DATA:  No specialty comments available.  He reports that he has never smoked. He has never used smokeless tobacco. No results for input(s): HGBA1C, LABURIC in the last 8760 hours. Medications & Allergies reviewed per EMR Patient Active Problem List   Diagnosis Date Noted  . 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 Migraine 05/26/2016  . Family history of colon cancer - suspected in paternal grandmother died at 45, father with polyps 06/28/2015  . HYPERLIPIDEMIA, WITH LOW HDL 04/20/2009  . PREMATURE VENTRICULAR CONTRACTIONS 02/20/2009  . ECHOCARDIOGRAM, ABNORMAL 02/20/2009  . GERD 11/09/2008  . LOOSE STOOLS 11/09/2008   Past Medical History:  Diagnosis Date  . Allergy   . GERD (gastroesophageal reflux disease)   . IBS (irritable bowel syndrome)    Family History  Problem Relation Age of Onset  . Colon polyps Father   . Heart disease Father   . Colon cancer Paternal Grandmother   . Prostate cancer Paternal Grandfather   . High Cholesterol Mother   . Rectal cancer Neg Hx   . Stomach cancer Neg Hx    Past Surgical History:  Procedure Laterality Date  . COLONOSCOPY    . TONSILLECTOMY     age 63   . UPPER GASTROINTESTINAL ENDOSCOPY     Social History   Occupational History  . MD    Social History Main Topics  . Smoking status: Never Smoker  . Smokeless tobacco: Never Used  . Alcohol use 0.0 oz/week     Comment: rare   .  Drug use: No  . Sexual activity: Not on file    OBJECTIVE:  VS:  HT:5' 6.5" (168.9 cm)   WT:136 lb 9.6 oz (62 kg)  BMI:21.8    BP:110/70  HR:80bpm  TEMP: ( )  RESP:98 % EXAM: Findings:  WDWN, NAD, Non-toxic appearing Alert & appropriately interactive Not depressed or anxious appearing No increased work of breathing. Pupils are equal. EOM intact without nystagmus No clubbing or cyanosis of the extremities appreciated No significant rashes/lesions/ulcerations overlying the examined area. DP & PT pulses 2+/4.  No significant pretibial edema. Sensation intact to light touch in lower  extremities.  Right Knee: Overall joint is well aligned, no significant deformity.   No significant effusion.   ROM: 0 to 120.   Extensor mechanism intact No significant medial or lateral joint line tenderness.   Stable to varus/valgus strain & anterior/posterior drawer.  Normal Lachman's.   Negative McMurray's and Thessaly. He does have pain with notable testing but no palpable click.  ++++++++++++++++++++++++++++++++++++++++++++++++++++++++++++++++++ LIMITED MSK ULTRASOUND OF right knee Images were obtained and interpreted by myself, Teresa Coombs, DO  Images have been saved and stored to PACS system. Images obtained on: GE S7 Ultrasound machine  FINDINGS:  Patella & Patellar Tendon: Normal Quad & Quad Tendon: Normal Suprapatellar Pouch: Normal, no effusion Medial Joint Line: Normal medial meniscus Lateral Joint Line: Normal lateral meniscus.  There is a small amount of swelling and hypoechoic change deep to the IT band as it crosses the femoral condyle. Trochlea: Normal appearing cartilage Posterior knee: N/a  IMPRESSION:  IT band friction syndrome     No results found. ASSESSMENT & PLAN:   Problem List Items Addressed This Visit    IT band syndrome - Primary    Symptoms are consistent with IT band syndrome of the right knee.  Discussed multiple options today including passive etiology of this.  And return to exercise recommendations.  Topical Pennsaid 2 weeks then as needed. Therapeutic exercises focusing on tensor fascia lata stretching, glute medius recruitment and IT band release reviewed in detail with athletic training staff today.    +++++++++++++++++++++++++++++++++++++++++++++++++++++++++++++++ PROCEDURE NOTE: THERAPEUTIC EXERCISES (97110) 15 minutes spent for Therapeutic exercises as stated in above notes.  This included exercises focusing on stretching, strengthening, with significant focus on eccentric aspects.   Proper technique shown and discussed  handout in great detail with ATC.  All questions were discussed and answered.           Follow-up: Return if symptoms worsen or fail to improve.   CMA/ATC served as Education administrator during this visit. History, Physical, and Plan performed by medical provider. Documentation and orders reviewed and attested to.      Teresa Coombs, [place] Physician

## 2016-07-16 NOTE — Patient Instructions (Addendum)
Please perform the exercise program that Jeneen Rinks has prepared for you and gone over in detail on a daily basis.  In addition to the handout you were provided you can access your program through: [email or web address]   Your unique program code is: [email or web address]

## 2016-07-21 DIAGNOSIS — M25569 Pain in unspecified knee: Secondary | ICD-10-CM | POA: Insufficient documentation

## 2016-07-21 NOTE — Assessment & Plan Note (Addendum)
Symptoms are consistent with IT band syndrome of the right knee.  Discussed multiple options today including passive etiology of this.  And return to exercise recommendations.  Topical Pennsaid 2 weeks then as needed. Therapeutic exercises focusing on tensor fascia lata stretching, glute medius recruitment and IT band release reviewed in detail with athletic training staff today.    +++++++++++++++++++++++++++++++++++++++++++++++++++++++++++++++ PROCEDURE NOTE: THERAPEUTIC EXERCISES (97110) 15 minutes spent for Therapeutic exercises as stated in above notes.  This included exercises focusing on stretching, strengthening, with significant focus on eccentric aspects.   Proper technique shown and discussed handout in great detail with ATC.  All questions were discussed and answered.

## 2016-07-22 ENCOUNTER — Ambulatory Visit: Payer: 59

## 2016-08-08 ENCOUNTER — Ambulatory Visit: Payer: Self-pay

## 2016-08-08 ENCOUNTER — Encounter: Payer: Self-pay | Admitting: Sports Medicine

## 2016-08-08 ENCOUNTER — Ambulatory Visit (INDEPENDENT_AMBULATORY_CARE_PROVIDER_SITE_OTHER): Payer: 59 | Admitting: Sports Medicine

## 2016-08-08 VITALS — BP 102/62 | HR 81 | Ht 66.5 in | Wt 137.0 lb

## 2016-08-08 DIAGNOSIS — M25561 Pain in right knee: Secondary | ICD-10-CM | POA: Diagnosis not present

## 2016-08-08 DIAGNOSIS — M7631 Iliotibial band syndrome, right leg: Secondary | ICD-10-CM

## 2016-08-08 NOTE — Progress Notes (Signed)
OFFICE VISIT NOTE Billy Ferguson. [name], [name]  DEVINE KLINGEL - 46 y.o. male MRN [id number]  Date of birth: Sep 19, 1970  Visit Date: 08/08/2016  PCP: Marton Redwood, MD   Referred by: Marton Redwood, MD  Burlene Arnt, CMA acting as scribe for Dr. Paulla Fore.  SUBJECTIVE:   Chief Complaint  Patient presents with  . right knee pain   HPI: As below and per problem based documentation when appropriate.  Mr. Billy Ferguson presents today in follow-up of right knee pain. The pain is lateral and radiates behind the knee. He has taken Ibuprofen but that is more for his back pain than his knee pain. He was given home exercises at his last visit.   Pt reports not improvement since his last visit. He was doing his home exercises but stopped when he realized he was still having pain when running. He was also using Pennsaid but got little relief. He is requesting steroid injection today.   Pt denies fever, chills, night sweats.      Review of Systems  Constitutional: Negative for chills and fever.  Respiratory: Negative for shortness of breath and wheezing.   Cardiovascular: Negative for chest pain, palpitations and leg swelling.  Musculoskeletal: Positive for joint pain. Negative for falls.  Neurological: Negative for dizziness, tingling and headaches.  Endo/Heme/Allergies: Does not bruise/bleed easily.    Otherwise per HPI.  HISTORY & PERTINENT PRIOR DATA:  No specialty comments available. He reports that he has never smoked. He has never used smokeless tobacco. No results for input(s): HGBA1C, LABURIC in the last 8760 hours. Medications & Allergies reviewed per EMR Patient Active Problem List   Diagnosis Date Noted  . 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 Migraine 05/26/2016  . Family history of colon cancer - suspected in paternal grandmother died at 33, father with polyps 06/28/2015  . HYPERLIPIDEMIA, WITH LOW HDL  04/20/2009  . PREMATURE VENTRICULAR CONTRACTIONS 02/20/2009  . ECHOCARDIOGRAM, ABNORMAL 02/20/2009  . GERD 11/09/2008  . LOOSE STOOLS 11/09/2008   Past Medical History:  Diagnosis Date  . Allergy   . GERD (gastroesophageal reflux disease)   . IBS (irritable bowel syndrome)    Family History  Problem Relation Age of Onset  . Colon polyps Father   . Heart disease Father   . Colon cancer Paternal Grandmother   . Prostate cancer Paternal Grandfather   . High Cholesterol Mother   . Rectal cancer Neg Hx   . Stomach cancer Neg Hx    Past Surgical History:  Procedure Laterality Date  . COLONOSCOPY    . TONSILLECTOMY     age 47   . UPPER GASTROINTESTINAL ENDOSCOPY     Social History   Occupational History  . MD    Social History Main Topics  . Smoking status: Never Smoker  . Smokeless tobacco: Never Used  . Alcohol use 0.0 oz/week     Comment: rare   . Drug use: No  . Sexual activity: Not on file    OBJECTIVE:  VS:  HT:5' 6.5" (168.9 cm)   WT:137 lb (62.1 kg)  BMI:21.8    BP:102/62  HR:81bpm  TEMP: ( )  RESP:98 % EXAM: Findings:  Knee is overall well aligned.  No significant deformity.  He has ligamentously stable.  He has pain with notable testing.  Hip abduction strength is moderate with T FL predominance.  No pain with McMurray's or Thessaly.     No  results found. ASSESSMENT & PLAN:   Problem List Items Addressed This Visit    IT band syndrome    PROCEDURE NOTE -  ULTRASOUND GUIDEDINJECTION: Right ITB insertion Images were obtained and interpreted by myself, Teresa Coombs, DO  Images have been saved and stored to PACS system. Images obtained on: GE S7 Ultrasound machine  ULTRASOUND FINDINGS: Hypoechoic change along the lateral femoral condyle just deep to the iliotibial band consistent with iliotibial band friction syndrome  DESCRIPTION OF PROCEDURE:  The patient's clinical condition is marked by substantial pain and/or significant functional  disability. Other conservative therapy has not provided relief, is contraindicated, or not appropriate. There is a reasonable likelihood that injection will significantly improve the patient's pain and/or functional impairment. After discussing the risks, benefits and expected outcomes of the injection and all questions were reviewed and answered, the patient wished to undergo the above named procedure. Verbal consent was obtained. The ultrasound was used to identify the target structure and adjacent neurovascular structures. The skin was then prepped in sterile fashion and the target structure was injected under direct visualization using sterile technique as below: PREP: Alcohol, Ethel Chloride APPROACH: 25g 1.5" needle INJECTATE: 2cc 1% lidocaine, 2cc 40mg  DepoMedrol ASPIRATE: N/A DRESSING: Band-Aid and compression  Post procedural instructions including recommending icing and warning signs for infection were reviewed. This procedure was well tolerated and there were no complications.   IMPRESSION: Succesful US Guided Injection           Other Visit Diagnoses    Right knee pain, unspecified chronicity    -  Primary   Relevant Orders   [place] LIMITED JOINT SPACE STRUCTURES LOW RIGHT(NO LINKED CHARGES)      Follow-up: Return in about 6 weeks (around 09/19/2016).    CMA/ATC served as Education administrator during this visit. History, Physical, and Plan performed by medical provider. Documentation and orders reviewed and attested to.      Teresa Coombs, [place] [facility] Physician

## 2016-08-08 NOTE — Patient Instructions (Addendum)

## 2016-08-18 NOTE — Assessment & Plan Note (Signed)
PROCEDURE NOTE -  ULTRASOUND GUIDEDINJECTION: Right ITB insertion Images were obtained and interpreted by myself, Teresa Coombs, DO  Images have been saved and stored to PACS system. Images obtained on: GE S7 Ultrasound machine  ULTRASOUND FINDINGS: Hypoechoic change along the lateral femoral condyle just deep to the iliotibial band consistent with iliotibial band friction syndrome  DESCRIPTION OF PROCEDURE:  The patient's clinical condition is marked by substantial pain and/or significant functional disability. Other conservative therapy has not provided relief, is contraindicated, or not appropriate. There is a reasonable likelihood that injection will significantly improve the patient's pain and/or functional impairment. After discussing the risks, benefits and expected outcomes of the injection and all questions were reviewed and answered, the patient wished to undergo the above named procedure. Verbal consent was obtained. The ultrasound was used to identify the target structure and adjacent neurovascular structures. The skin was then prepped in sterile fashion and the target structure was injected under direct visualization using sterile technique as below: PREP: Alcohol, Ethel Chloride APPROACH: 25g 1.5" needle INJECTATE: 2cc 1% lidocaine, 2cc 40mg  DepoMedrol ASPIRATE: N/A DRESSING: Band-Aid and compression  Post procedural instructions including recommending icing and warning signs for infection were reviewed. This procedure was well tolerated and there were no complications.   IMPRESSION: Succesful US Guided Injection

## 2016-10-13 ENCOUNTER — Telehealth: Payer: Self-pay | Admitting: Sports Medicine

## 2016-10-13 DIAGNOSIS — M25561 Pain in right knee: Principal | ICD-10-CM

## 2016-10-13 DIAGNOSIS — G8929 Other chronic pain: Secondary | ICD-10-CM

## 2016-10-13 NOTE — Telephone Encounter (Signed)
Received a call from patient regarding his knee.  He is continued to have significant lateral pain that is interfering with his day-to-day activities and prohibiting him from being active.  He has failed conservative management with therapeutic exercises, corticosteroid injections, topical Pennsaid and rest.   We will plan to follow-up with him after the MRI of his knee is obtained.

## 2016-10-16 ENCOUNTER — Telehealth: Payer: Self-pay | Admitting: Sports Medicine

## 2016-10-16 NOTE — Telephone Encounter (Signed)
Patient called to check on his MRI referral, I gave him the number to [place] where it was placed.

## 2017-01-29 DIAGNOSIS — Z23 Encounter for immunization: Secondary | ICD-10-CM | POA: Diagnosis not present

## 2017-01-29 DIAGNOSIS — D225 Melanocytic nevi of trunk: Secondary | ICD-10-CM | POA: Diagnosis not present

## 2017-01-29 DIAGNOSIS — D2261 Melanocytic nevi of right upper limb, including shoulder: Secondary | ICD-10-CM | POA: Diagnosis not present

## 2017-01-29 DIAGNOSIS — D485 Neoplasm of uncertain behavior of skin: Secondary | ICD-10-CM | POA: Diagnosis not present

## 2017-01-29 DIAGNOSIS — L821 Other seborrheic keratosis: Secondary | ICD-10-CM | POA: Diagnosis not present

## 2017-01-29 DIAGNOSIS — D2372 Other benign neoplasm of skin of left lower limb, including hip: Secondary | ICD-10-CM | POA: Diagnosis not present

## 2017-04-24 ENCOUNTER — Telehealth: Payer: Self-pay | Admitting: Sports Medicine

## 2017-04-24 NOTE — Telephone Encounter (Signed)
Copied from [place]. Topic: Quick Communication - See Telephone Encounter >> Apr 24, 2017  3:44 PM Boyd Kerbs wrote: CRM for notification. See Telephone encounter for:   Pt. Is wanting to have referral to MRI done now.  Asking to put new referral in for MRI.Marland Kitchen He is saying it has not improved.   04/24/17.

## 2017-04-24 NOTE — Telephone Encounter (Signed)
Copied from [place]. Topic: Quick Communication - See Telephone Encounter >> Apr 24, 2017  3:44 PM Boyd Kerbs wrote: CRM for notification. See Telephone encounter for:   Pt. Is wanting to have referral to MRI done now.  Asking to put new referral in for MRI.Marland Kitchen He is saying it has not improved.   04/24/17.

## 2017-04-27 ENCOUNTER — Other Ambulatory Visit: Payer: Self-pay | Admitting: Physical Therapy

## 2017-04-27 DIAGNOSIS — M25561 Pain in right knee: Principal | ICD-10-CM

## 2017-04-27 DIAGNOSIS — G8929 Other chronic pain: Secondary | ICD-10-CM

## 2017-04-27 NOTE — Telephone Encounter (Signed)
Okay to reorder MRI of his knee.  I do want to follow up with him after the MRI is obtained.

## 2017-04-27 NOTE — Telephone Encounter (Signed)
Placed an order for a R knee MRI.  Called pt and LM for him to return call to [phone] r.e. R knee MRI.

## 2017-04-28 NOTE — Telephone Encounter (Signed)
Called and spoke to pt.  Informed him that we have placed another referral for him to get a R knee MRI.  Also informed him that Dr. Paulla Fore would like to f/u w/ him after his MRI and to schedule that appt for 2-3 days after his MRI appt.

## 2017-05-10 ENCOUNTER — Ambulatory Visit
Admission: RE | Admit: 2017-05-10 | Discharge: 2017-05-10 | Disposition: A | Discharge status: Home / Self Care | Payer: 59 | Source: Ambulatory Visit | Attending: Sports Medicine | Admitting: Sports Medicine

## 2017-05-10 DIAGNOSIS — M25561 Pain in right knee: Principal | ICD-10-CM

## 2017-05-10 DIAGNOSIS — M1711 Unilateral primary osteoarthritis, right knee: Secondary | ICD-10-CM | POA: Diagnosis not present

## 2017-05-10 DIAGNOSIS — G8929 Other chronic pain: Secondary | ICD-10-CM

## 2017-05-13 NOTE — Progress Notes (Signed)
Findings consistent with IT Band Syndrome.  No evidence of Meniscal tear.  Please call him and let him know I will try to call him personally tonight but I wanted him to get the results

## 2017-05-21 DIAGNOSIS — Z0000 Encounter for general adult medical examination without abnormal findings: Secondary | ICD-10-CM | POA: Diagnosis not present

## 2017-05-21 DIAGNOSIS — Z125 Encounter for screening for malignant neoplasm of prostate: Secondary | ICD-10-CM | POA: Diagnosis not present

## 2017-05-22 ENCOUNTER — Ambulatory Visit: Payer: 59 | Admitting: Sports Medicine

## 2017-05-25 ENCOUNTER — Encounter: Payer: Self-pay | Admitting: Sports Medicine

## 2017-05-25 ENCOUNTER — Ambulatory Visit: Payer: 59 | Admitting: Sports Medicine

## 2017-05-25 VITALS — BP 104/70 | HR 81 | Ht 66.5 in | Wt 133.4 lb

## 2017-05-25 DIAGNOSIS — M7631 Iliotibial band syndrome, right leg: Secondary | ICD-10-CM | POA: Diagnosis not present

## 2017-05-25 DIAGNOSIS — G8929 Other chronic pain: Secondary | ICD-10-CM | POA: Diagnosis not present

## 2017-05-25 DIAGNOSIS — M25561 Pain in right knee: Secondary | ICD-10-CM | POA: Diagnosis not present

## 2017-05-25 DIAGNOSIS — M1711 Unilateral primary osteoarthritis, right knee: Secondary | ICD-10-CM

## 2017-05-25 NOTE — Progress Notes (Signed)
Billy Ferguson  Billy Ferguson - 47 y.o. male Billy Ferguson  Date of birth: 1971-02-04  Visit Date: 05/25/2017  PCP: Billy Redwood, MD   Referred by: Billy Redwood, MD  Scribe for today's visit: Wendy Poet, LAT, ATC     SUBJECTIVE:  Billy Ferguson is here for Follow-up (R knee pain) .   Notes from OV on 08/08/16: [name] presents today in follow-up of right knee pain. The pain is lateral and radiates behind the knee. He has taken Ibuprofen but that is more for his back pain than his knee pain. He was given home exercises at his last visit.   Pt reports not improvement since his last visit. He was doing his home exercises but stopped when he realized he was still having pain when running. He was also using Pennsaid but got little relief. He is requesting steroid injection today.   Pt denies fever, chills, night sweats.    05/25/17: Compared to the last office visit on 08/08/16, his previously described R knee pain symptoms are worsening. Current symptoms are mild-mod & are nonradiating.  No swelling but pain has changed from only being along the R lateral knee to now also involving the the R medial knee.  He also feels that his muscle mass/tone has decreased in his B thighs. He has been taking IBU as needed.  He is no longer using the Pennsaid.   Had a R knee injection at his last visit.  Had a R knee MRI on 05/10/17.   ROS Denies night time disturbances. Denies fevers, chills, or night sweats. Denies unexplained weight loss. Denies personal history of cancer. Denies changes in bowel or bladder habits. Denies recent unreported falls. Denies new or worsening dyspnea or wheezing. Denies headaches or dizziness.  Denies numbness, tingling or weakness  In the extremities.  Denies dizziness or presyncopal episodes Denies lower extremity edema    HISTORY & PERTINENT PRIOR DATA:    Prior History reviewed and updated per electronic medical record.  Significant/pertinent history, findings, studies include:  reports that he has never smoked. He has never used smokeless tobacco. No results for input(s): HGBA1C, LABURIC, CREATINE in the last 8760 hours. No specialty comments available. No problems updated.  OBJECTIVE:  VS:  HT:5' 6.5" (168.9 cm)   WT:133 lb 6.4 oz (60.5 kg)  BMI:21.21    BP:104/70  HR:81bpm  TEMP: ( )  RESP:99 %   PHYSICAL EXAM: Constitutional: WDWN, Non-toxic appearing. Psychiatric: Alert & appropriately interactive.  Not depressed or anxious appearing. Respiratory: No increased work of breathing.  Trachea Midline Eyes: Pupils are equal.  EOM intact without nystagmus.  No scleral icterus  Vascular Exam: warm to touch no edema  lower extremity neuro exam: unremarkable  MSK Exam: Knee is overall well aligned.  No maltracking.  Ligamentously stable.  He has hip abduction weakness as well as mild pain along the lateral aspect of the knee and Gertie's tubercle.   ASSESSMENT & PLAN:   1. Chronic pain of right knee   2. Iliotibial band syndrome of right side   3. Primary osteoarthritis of right knee     PLAN: Discussed the foundation of treatment for this condition is physical therapy and/or daily (5-6 days/week) therapeutic exercises, focusing on core strengthening, coordination, neuromuscular control/reeducation.  Therapeutic exercises prescribed per procedure note.  PROCEDURE NOTE: THERAPEUTIC EXERCISES (97110) 15 minutes spent for Therapeutic exercises as below and  as referenced in the AVS.  This included exercises focusing on stretching, strengthening, with significant focus on eccentric aspects.   Proper technique shown and discussed handout in great detail with ATC.  All questions were discussed and answered.   Long term goals include an improvement in range of motion, strength, endurance as well as avoiding reinjury. Frequency of  visits is one time as determined during today's  office visit. Frequency of exercises to be performed is as per handout.  EXERCISES REVIEWED:  Hip ABduction strengthening with focus on Glute Medius Recruitment  VMO Strengthening  IT band strengthening exercises  Follow-up: Return if symptoms worsen or fail to improve.      Please see additional documentation for Objective, Assessment and Plan sections. Pertinent additional documentation may be included in corresponding procedure notes, imaging studies, problem based documentation and patient instructions. Please see these sections of the encounter for additional information regarding this visit.  CMA/ATC served as Education administrator during this visit. History, Physical, and Plan performed by medical provider. Documentation and orders reviewed and attested to.      Gerda Diss, [place] [facility] Physician

## 2017-05-25 NOTE — Patient Instructions (Addendum)
Also check out [employer]" which is a program developed by Dr. Minerva Ends.   There are links to a couple of his YouTube Videos below and I would like to see performing one of his videos 5-6 days per week.    A good intro video is: "Independence from Pain 7-minute Video" - [email or web address]   His more advanced video is: "Powerful Posture and Pain Relief: 12 minutes of Foundation Training" - https://youtu.be/[email or web address]  Do not try to attempt this entire video when first beginning.    Try breaking of each exercise that he goes into shorter segments.  Otherwise if they perform an exercise for 45 seconds, start with 15 seconds and rest and then resume when they begin the new activity.    If you work your way up to doing this 12 minute video, I expect you will see significant improvements in your pain.  If you enjoy his videos and would like to find out more you can look on his website: [email or web address]  He has a workout streaming option as well as a DVD set available for purchase.  Amazon has the best price for his DVDs.    Please perform the exercise program that we have prepared for you and gone over in detail on a daily basis.  In addition to the handout you were provided you can access your program through: [email or web address]   Your unique program code is:  [email or web address]

## 2017-05-26 DIAGNOSIS — Z1389 Encounter for screening for other disorder: Secondary | ICD-10-CM | POA: Diagnosis not present

## 2017-05-26 DIAGNOSIS — K589 Irritable bowel syndrome without diarrhea: Secondary | ICD-10-CM | POA: Diagnosis not present

## 2017-05-26 DIAGNOSIS — Z0000 Encounter for general adult medical examination without abnormal findings: Secondary | ICD-10-CM | POA: Diagnosis not present

## 2017-05-26 DIAGNOSIS — Z800 Family history of malignant neoplasm of digestive organs: Secondary | ICD-10-CM | POA: Diagnosis not present

## 2017-05-26 DIAGNOSIS — M6250 Muscle wasting and atrophy, not elsewhere classified, unspecified site: Secondary | ICD-10-CM | POA: Diagnosis not present

## 2017-05-26 DIAGNOSIS — Z6821 Body mass index (BMI) 21.0-21.9, adult: Secondary | ICD-10-CM | POA: Diagnosis not present

## 2018-12-25 IMAGING — MR MR KNEE*R* W/O CM
4 of 6 series · 19 of 40 positions shown · non-contrast
Comparison: None.

CLINICAL DATA: Right lateral knee pain for 1 year. No specific
injury. Patient is a runner.

EXAM:
MRI OF THE RIGHT KNEE WITHOUT CONTRAST
TECHNIQUE: Multiplanar, multisequence MR imaging of the knee was performed. No
intravenous contrast was administered.

[Series 3: PD fat-sat · axial · 4.0mm · 0.31mm/px · z∈[-83,+22]mm · 8 of 25 slices shown (1 of 4)]
[im 1/25]
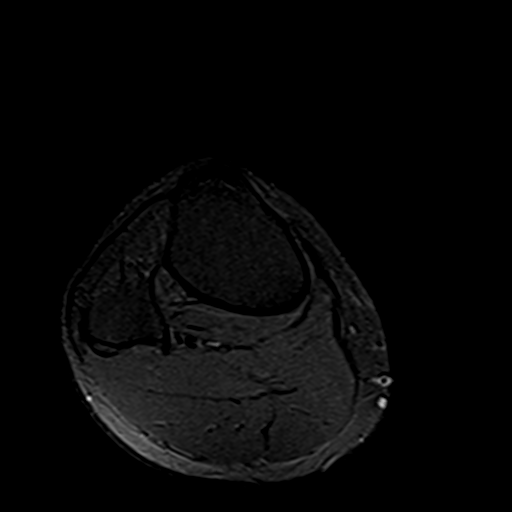
[im 4/25]
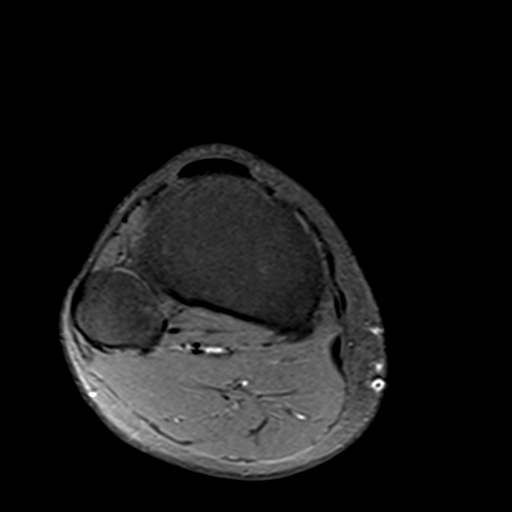
[im 7/25]
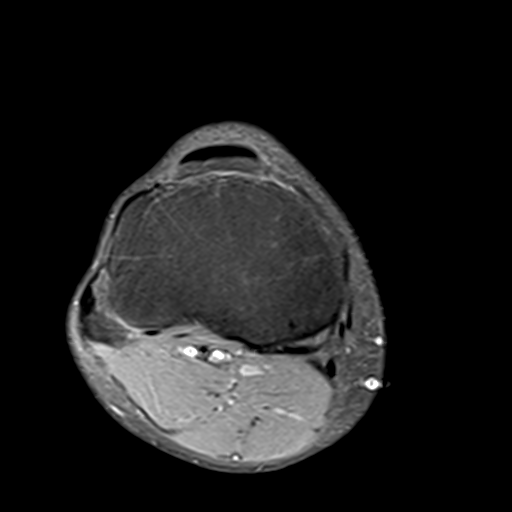
[im 11/25]
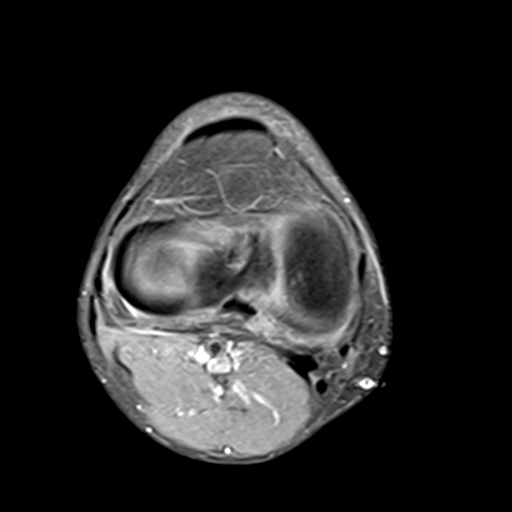
[im 14/25]
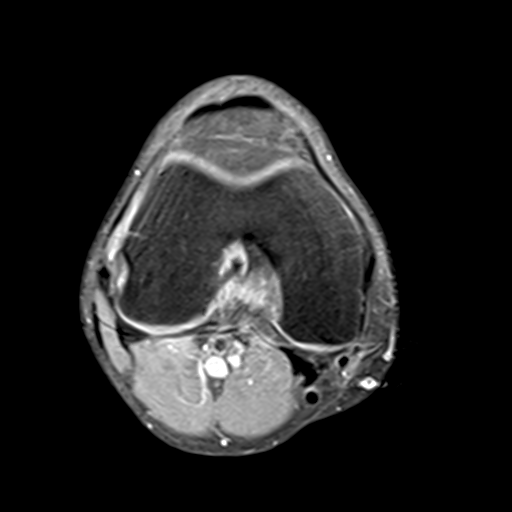
[im 18/25]
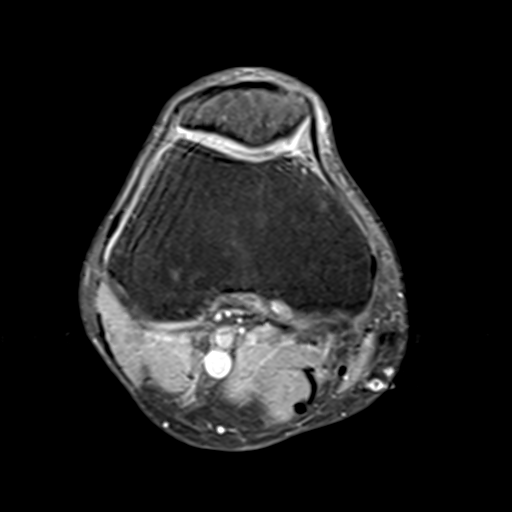
[im 21/25]
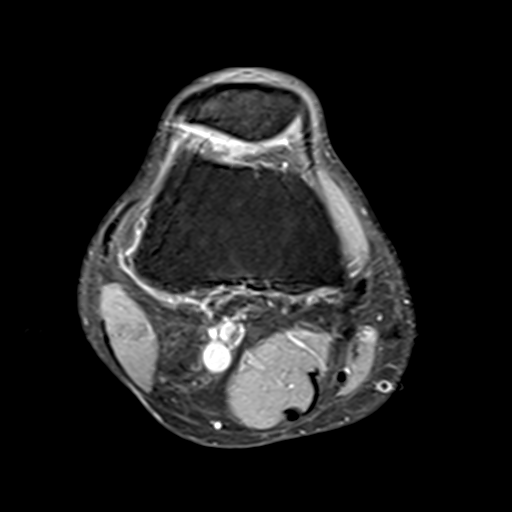
[im 25/25]
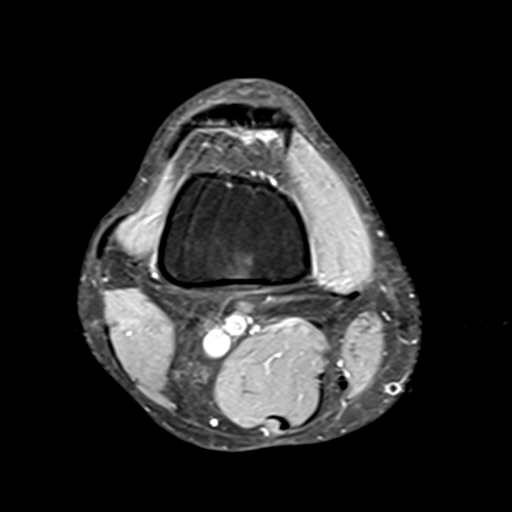

[Series 6: PD fat-sat · sagittal · 4.0mm · 0.31mm/px · 5 of 22 slices shown (2 of 4)]
[im 1/22]
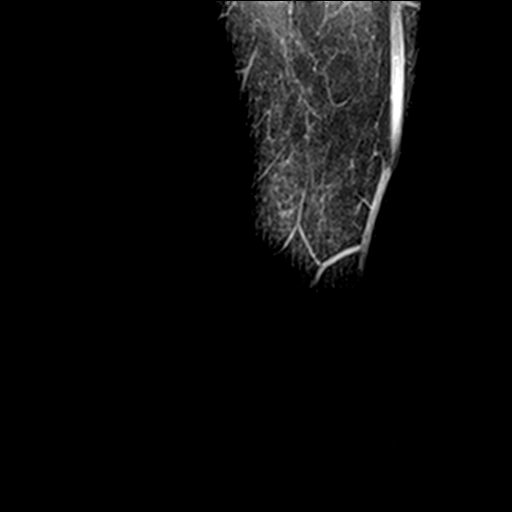
[im 4/22]
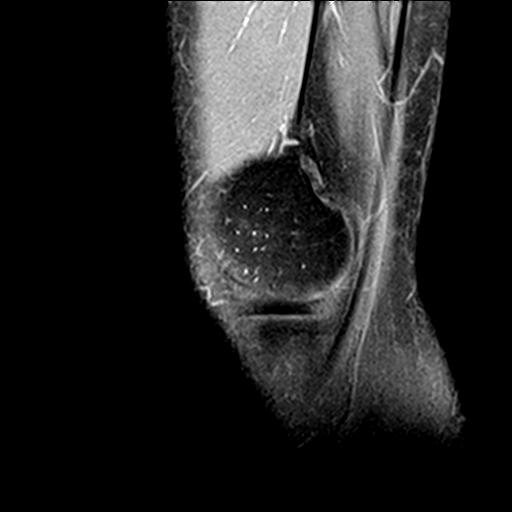
[im 8/22]
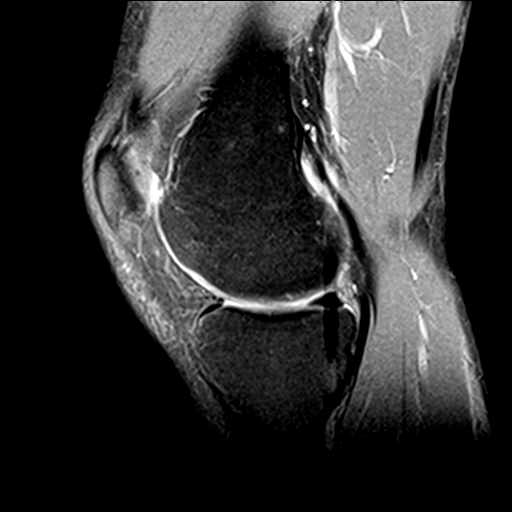
[im 11/22]
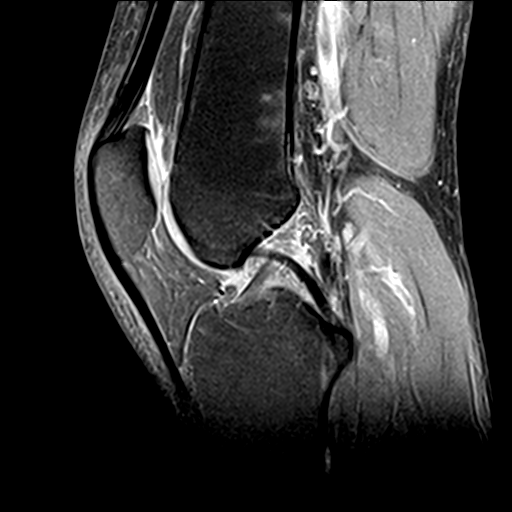
[im 18/22]
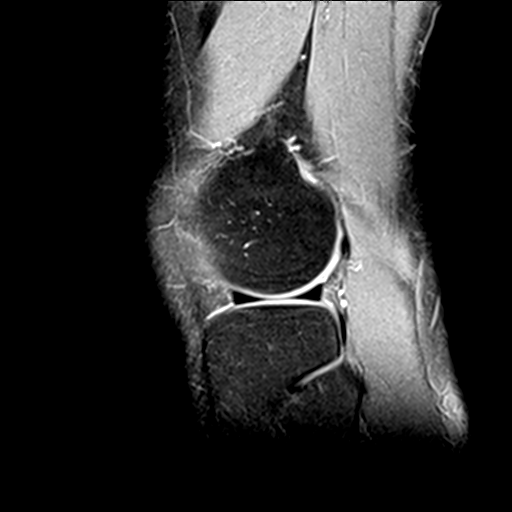

[Series 7: PD fat-sat · coronal · 4.0mm · 0.31mm/px · 3 of 21 slices shown (3 of 4)]
[im 4/21]
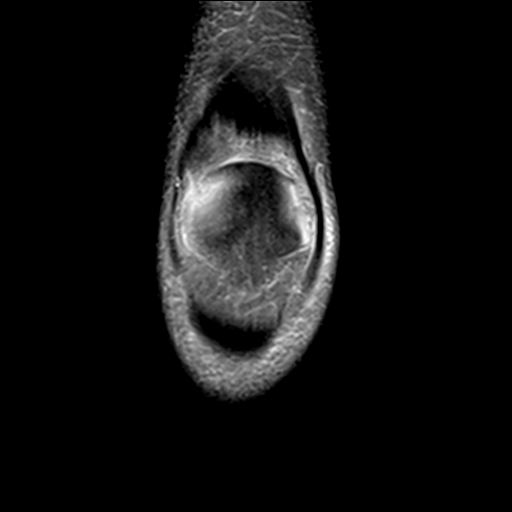
[im 11/21]
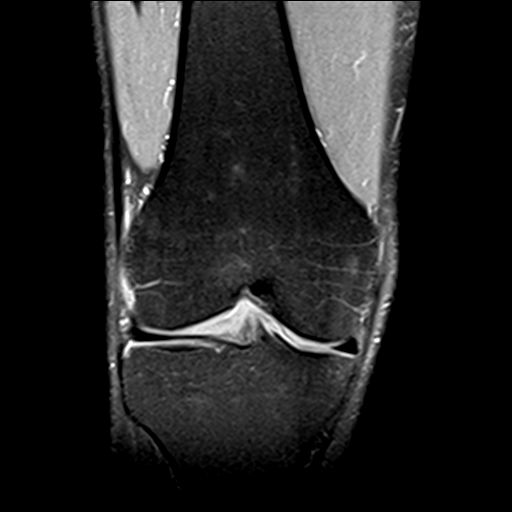
[im 17/21]
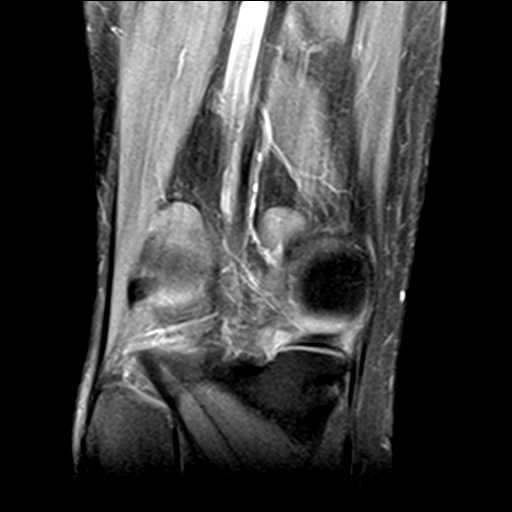

[Series 8: PD fat-sat · coronal · 2.0mm · 0.29mm/px · 3 of 11 slices shown (4 of 4)]
[im 1/11]
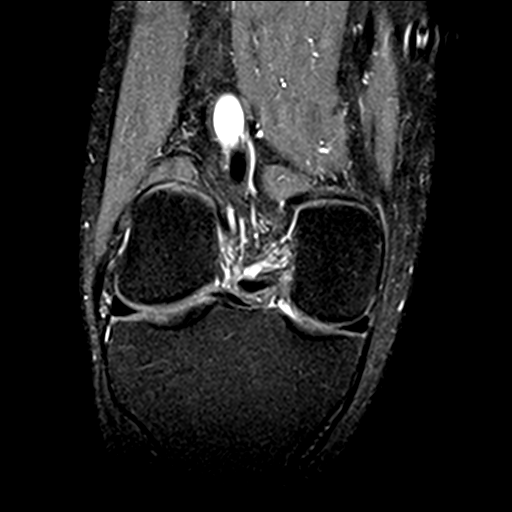
[im 7/11]
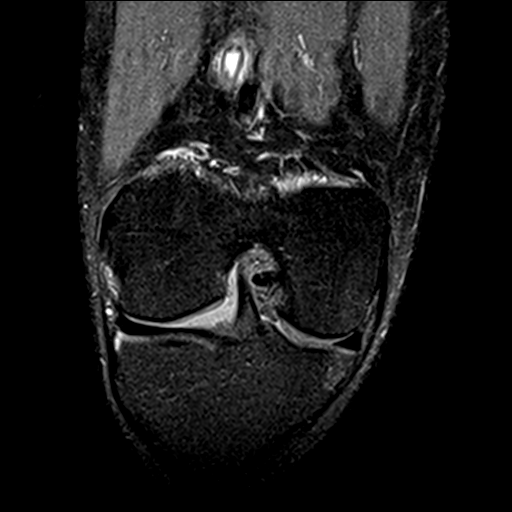
[im 11/11]
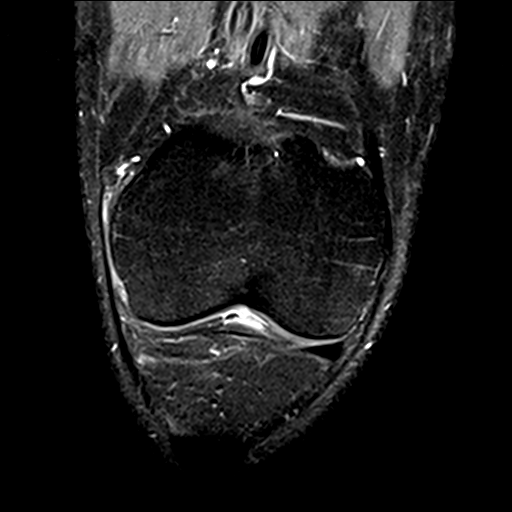

[19 of 40 positions shown; findings below may reference images not displayed]

FINDINGS: MENISCI

Medial meniscus:  Intact

Lateral meniscus:  Intact

LIGAMENTS

Cruciates:  Intact

Collaterals:  Intact

CARTILAGE

Patellofemoral:  Minimal degenerative chondrosis.

Medial:  Mild degenerative chondrosis

Lateral:  Minimal degenerative chondrosis

Joint:  No joint effusion.

Popliteal Fossa:  No popliteal mass or Baker's cyst.

Extensor Mechanism: The patella retinacular structures are intact
and the quadriceps and patellar tendons are intact.

Bones: No acute bony findings. There is a focal area of subchondral
cystic type change involving the medial tibial plateau which is
probably an intraosseous ganglion.

Other: Normal knee musculature. Slight thickening and increased T2
signal intensity in the iliotibial band with a small amount of
surrounding fluid could suggest iliotibial band syndrome. Recommend
clinical correlation.
IMPRESSION: 1. Intact ligamentous structures and no acute bony findings.
2. No meniscal tears.
3. Mild tricompartmental degenerative changes.
4. Changes suggesting iliotibial band syndrome.

## 2020-02-29 ENCOUNTER — Other Ambulatory Visit: Payer: Self-pay | Admitting: Internal Medicine

## 2020-02-29 DIAGNOSIS — Z0000 Encounter for general adult medical examination without abnormal findings: Secondary | ICD-10-CM

## 2020-02-29 DIAGNOSIS — E785 Hyperlipidemia, unspecified: Secondary | ICD-10-CM

## 2020-11-28 ENCOUNTER — Telehealth: Payer: Self-pay | Admitting: Internal Medicine

## 2020-11-28 NOTE — Telephone Encounter (Signed)
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  He needs to schedule a colonoscopy please contact him on his phone to arrange a previsit and direct colonoscopy given family history of colon cancer and polyps

## 2020-11-28 NOTE — Telephone Encounter (Signed)
I left a detailed voicemail message on his cell # [phone] to call me back to get his colon and the pre-visit set up.

## 2021-01-25 NOTE — Telephone Encounter (Signed)
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 He will call us back to set up a colonoscopy and pre-visit.

## 2023-03-03 ENCOUNTER — Telehealth: Payer: Self-pay | Admitting: Internal Medicine

## 2023-03-03 DIAGNOSIS — Z800 Family history of malignant neoplasm of digestive organs: Secondary | ICD-10-CM

## 2023-03-03 NOTE — Telephone Encounter (Signed)
 Patient needs to schedule colonoscopy  Please contact Dr. Tyson Alias and arrange pre-visit + direct colonoscopy  Encounter Diagnosis  Name Primary?   Family history of colon cancer - suspected in paternal grandmother died at 29, father with polyps Yes

## 2023-03-06 NOTE — Telephone Encounter (Signed)
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 Last one done 2017.I told him I would send Dr Leone Payor a message to advise on that and see if he needs an office visit. He is aware Dr Leone Payor is out of the office until Tuesday.

## 2023-03-09 NOTE — Telephone Encounter (Signed)
I communicated with him - no EGD is needed

## 2023-03-11 NOTE — Telephone Encounter (Signed)
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

## 2023-04-14 ENCOUNTER — Telehealth: Payer: Self-pay

## 2023-04-14 NOTE — Telephone Encounter (Signed)
 I am mailing him a letter to please call and set up his colonoscopy and pre-visit.

## 2023-04-14 NOTE — Telephone Encounter (Signed)
 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 I will mail him a letter to try and reach him.

## 2023-06-17 ENCOUNTER — Encounter: Payer: Self-pay | Admitting: Internal Medicine

## 2023-07-17 ENCOUNTER — Telehealth: Payer: Self-pay

## 2023-07-17 ENCOUNTER — Telehealth: Payer: Self-pay | Admitting: Internal Medicine

## 2023-07-17 ENCOUNTER — Other Ambulatory Visit (HOSPITAL_COMMUNITY): Payer: Self-pay

## 2023-07-17 DIAGNOSIS — K219 Gastro-esophageal reflux disease without esophagitis: Secondary | ICD-10-CM

## 2023-07-17 DIAGNOSIS — K580 Irritable bowel syndrome with diarrhea: Secondary | ICD-10-CM

## 2023-07-17 DIAGNOSIS — R6881 Early satiety: Secondary | ICD-10-CM

## 2023-07-17 MED ORDER — NA SULFATE-K SULFATE-MG SULF 17.5-3.13-1.6 GM/177ML PO SOLN: 1.0000 | Freq: Once | ORAL | 0 refills | Status: AC

## 2023-07-17 MED ORDER — RIFAXIMIN 550 MG PO TABS: 550.0000 mg | ORAL_TABLET | Freq: Three times a day (TID) | ORAL | 1 refills | Status: AC

## 2023-07-17 NOTE — Telephone Encounter (Signed)
 Called and spoke with the patient. He states that he has never taken this medication. However he does have a history of cardiomyopathy which would be a contraindication to a tricyclic anti-depressant.

## 2023-07-17 NOTE — Telephone Encounter (Signed)
 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  He has an upcoming colonoscopy appointment.  He said several months of abdominal bloating, early satiety gaseousness, loose stools.  He reports that he had this last year, and was prescribed Xifaxan by primary care and did get several weeks of relief.  He also reports that when he takes a colonoscopy prep the symptoms go away.  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  He is fairly miserable with all of the symptoms.  He does have chronic PPI use for GERD times decades.   I am prescribing Xifaxan again.  We also need to change colonoscopy appointment to EGD plus colonoscopy which will require an appointment on a different day.  The diagnosis for the EGD is GERD, early satiety.  Please contact him and arrange this.  Meds ordered this encounter  Medications   rifaximin (XIFAXAN) 550 MG TABS tablet    Sig: Take 1 tablet (550 mg total) by mouth 3 (three) times daily.    Dispense:  42 tablet    Refill:  1

## 2023-07-17 NOTE — Telephone Encounter (Signed)
 Pharmacy Patient Advocate Encounter   PA required; PA submitted to above mentioned insurance via CoverMyMeds Key/confirmation #/EOC [id number] Status is pending

## 2023-07-17 NOTE — Telephone Encounter (Signed)
 Colon/EGD rescheduled with patient. Pre-visit appointment scheduled. Rx sent for Suprep. Instructions printed and mailed to patient.

## 2023-07-17 NOTE — Telephone Encounter (Signed)
 Pharmacy Patient Advocate Encounter   Received notification from CoverMyMeds that prior authorization for Xifaxan 550MG  tablets is required/requested.   Insurance verification completed.   The patient is insured through [facility] .   Per test claim: Prior Authorization form/request asks a question that requires your assistance. Please see the question below and advise accordingly. The PA will not be submitted until the necessary information is received.

## 2023-07-21 NOTE — Telephone Encounter (Signed)
 The patient has been notified of this information and all questions answered.

## 2023-07-21 NOTE — Telephone Encounter (Signed)
 Pharmacy Patient Advocate Encounter  Received notification from [facility] that Prior Authorization for Xifaxan  550MG  tablets has been APPROVED from 07-20-2023 to 07-19-2024   PA #/Case ID/Reference #: [id number]

## 2023-08-13 ENCOUNTER — Encounter: Payer: Self-pay | Admitting: Internal Medicine

## 2023-09-14 ENCOUNTER — Telehealth

## 2023-09-14 ENCOUNTER — Encounter

## 2023-09-22 ENCOUNTER — Encounter: Admitting: Internal Medicine

## 2023-10-22 ENCOUNTER — Ambulatory Visit (AMBULATORY_SURGERY_CENTER)

## 2023-10-22 VITALS — Ht 66.0 in | Wt 147.0 lb

## 2023-10-22 DIAGNOSIS — K589 Irritable bowel syndrome without diarrhea: Secondary | ICD-10-CM

## 2023-10-22 DIAGNOSIS — R6881 Early satiety: Secondary | ICD-10-CM

## 2023-10-22 NOTE — Progress Notes (Signed)
 No issues known to pt with past sedation with any surgeries or procedures Patient denies ever being told they had issues or difficulty with intubation  No FH of Malignant Hyperthermia Pt is not on diet pills nor GLP-1 medications Pt is not on home 02  Pt is not on blood thinners  Pt denies issues with chronic constipation  No A fib or A flutter Have any cardiac testing pending--no Pt instructed to use Singlecare.[email or web address] or GoodRx for a price reduction on prep  Ambulates independently Patient has prep medication

## 2023-10-28 ENCOUNTER — Encounter: Payer: Self-pay | Admitting: Internal Medicine

## 2023-11-01 NOTE — Progress Notes (Signed)
 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  He also has a family history of colon polyps and cancer as outlined above.  Last examinations were in 2017 and were normal.  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  Past Medical History:  Diagnosis Date   Allergy    Cardiomyopathy (HCC)    GERD (gastroesophageal reflux disease)    IBS (irritable bowel syndrome)     Past Surgical History:  Procedure Laterality Date   COLONOSCOPY     TONSILLECTOMY     age 33    UPPER GASTROINTESTINAL ENDOSCOPY       Current Outpatient Medications  Medication Sig Dispense Refill   omeprazole (PRILOSEC) 20 MG capsule Take 20 mg by mouth daily.     fexofenadine (ALLEGRA) 180 MG tablet Take 180 mg by mouth daily. As needed (Patient not taking: Reported on 10/22/2023)     rifaximin  (XIFAXAN ) 550 MG TABS tablet Take 1 tablet (550 mg total) by mouth 3 (three) times daily. (Patient not taking: Reported on 10/22/2023) 42 tablet 1   sildenafil (VIAGRA) 100 MG tablet TAKE 1/2 TO 1 TABLET BY MOUTH ONCE A DAY; Duration: 30 (Patient not taking: Reported on 11/02/2023)     Current Facility-Administered Medications  Medication Dose Route Frequency Provider Last Rate Last Admin   0.9 %  sodium chloride  infusion  500 mL Intravenous Once Billy Lupita BRAVO, MD        Allergies as of 11/02/2023 - Review Complete 11/02/2023  Allergen Reaction Noted   Egg-derived products Diarrhea    Peanut-containing drug products Other (See Comments)    Wheat Diarrhea 05/31/2015    Family History  Problem Relation Age of Onset   High Cholesterol Mother    Colon polyps Father     Heart disease Father    Colon cancer Paternal Grandmother    Prostate cancer Paternal Grandfather    Rectal cancer Neg Hx    Stomach cancer Neg Hx    Esophageal cancer Neg Hx     Social History   Socioeconomic History   Marital status: Married    Spouse name: Not on file   Number of children: 3   Years of education: MD   Highest education level: Not on file  Occupational History   Occupation: MD  Tobacco Use   Smoking status: Never   Smokeless tobacco: Never  Vaping Use   Vaping status: Never Used  Substance and Sexual Activity   Alcohol use: Yes    Alcohol/week: 0.0 standard drinks of alcohol    Comment: rare    Drug use: No   Sexual activity: Not on file  Other Topics Concern   Not on file  Social History Narrative   Lives at home w/ wife and children   Right-handed   Caffeine: rare   Social Drivers of Health   Financial Resource Strain: Low Risk  (10/21/2022)   Received from [facility]   Overall Financial Resource Strain (CARDIA)    Difficulty of Paying Living Expenses: Not hard at all  Food Insecurity: No Food Insecurity (10/21/2022)  Received from [facility]   Hunger Vital Sign    Within the past 12 months, you worried that your food would run out before you got the money to buy more.: Never true    Within the past 12 months, the food you bought just didn't last and you didn't have money to get more.: Never true  Transportation Needs: No Transportation Needs (10/21/2022)   Received from [facility]   PRAPARE - Transportation    Lack of Transportation (Medical): No    Lack of Transportation (Non-Medical): No  Ferguson Activity: Not on file  Stress: Not on file  Social Connections: Unknown (06/18/2021)   Received from [facility]   Social Network    Social Network: Not on file  Intimate Partner Violence: Unknown (05/23/2021)   Received from [facility]   HITS    Physically Hurt: Not on file    Insult or Talk Down To: Not on file    Threaten  Ferguson Harm: Not on file    Scream or Curse: Not on file    Review of Systems:  All other review of systems negative except as mentioned in the HPI.  Ferguson Exam: Vital signs BP 122/69   Pulse 73   Temp (!) 97.2 F (36.2 C) (Skin)   Ht 5' 6.5 (1.689 m)   Wt 147 lb (66.7 kg)   SpO2 100%   BMI 23.37 kg/m   General:   Alert,  Well-developed, well-nourished, pleasant and cooperative in NAD Lungs:  Clear throughout to auscultation.   Heart:  Regular rate and rhythm; no murmurs, clicks, rubs,  or gallops. Abdomen:  Soft, nontender and nondistended. Normal bowel sounds.   Neuro/Psych:  Alert and cooperative. Normal mood and affect. A and O x 3   @Billy Ferguson  Billy Commander, MD, [facility] [phone] (pager) 11/02/2023 2:40 PM@

## 2023-11-02 ENCOUNTER — Ambulatory Visit (AMBULATORY_SURGERY_CENTER): Admitting: Internal Medicine

## 2023-11-02 ENCOUNTER — Encounter: Payer: Self-pay | Admitting: Internal Medicine

## 2023-11-02 VITALS — BP 116/76 | HR 80 | Temp 97.2°F | Resp 12 | Ht 66.5 in | Wt 147.0 lb

## 2023-11-02 DIAGNOSIS — Z800 Family history of malignant neoplasm of digestive organs: Secondary | ICD-10-CM

## 2023-11-02 DIAGNOSIS — K648 Other hemorrhoids: Secondary | ICD-10-CM

## 2023-11-02 DIAGNOSIS — K317 Polyp of stomach and duodenum: Secondary | ICD-10-CM

## 2023-11-02 DIAGNOSIS — K219 Gastro-esophageal reflux disease without esophagitis: Secondary | ICD-10-CM | POA: Diagnosis not present

## 2023-11-02 DIAGNOSIS — Z1211 Encounter for screening for malignant neoplasm of colon: Secondary | ICD-10-CM

## 2023-11-02 DIAGNOSIS — K319 Disease of stomach and duodenum, unspecified: Secondary | ICD-10-CM | POA: Diagnosis not present

## 2023-11-02 DIAGNOSIS — K3189 Other diseases of stomach and duodenum: Secondary | ICD-10-CM

## 2023-11-02 DIAGNOSIS — D128 Benign neoplasm of rectum: Secondary | ICD-10-CM

## 2023-11-02 DIAGNOSIS — Z83719 Family history of colon polyps, unspecified: Secondary | ICD-10-CM

## 2023-11-02 DIAGNOSIS — R6881 Early satiety: Secondary | ICD-10-CM

## 2023-11-02 MED ORDER — SODIUM CHLORIDE 0.9 % IV SOLN: 500.0000 mL | Freq: Once | INTRAVENOUS | Status: DC

## 2023-11-02 NOTE — Op Note (Signed)
 [facility] Patient Name: Billy Ferguson Procedure Date: 11/02/2023 2:41 PM MRN: [id number] Endoscopist: Lupita FORBES Commander , MD, [id number] Age: 53 Referring MD:  Date of Birth: February 08, 1971 Gender: Male Account #: [id number] Procedure:                Upper GI endoscopy Indications:              Early satiety Medicines:                Monitored Anesthesia Care Procedure:                Pre-Anesthesia Assessment:                           - Prior to the procedure, a History and Physical                            was performed, and patient medications and                            allergies were reviewed. The patient's tolerance of                            previous anesthesia was also reviewed. The risks                            and benefits of the procedure and the sedation                            options and risks were discussed with the patient.                            All questions were answered, and informed consent                            was obtained. Prior Anticoagulants: The patient has                            taken no anticoagulant or antiplatelet agents. ASA                            Grade Assessment: II - A patient with mild systemic                            disease. After reviewing the risks and benefits,                            the patient was deemed in satisfactory condition to                            undergo the procedure.                           After obtaining informed consent, the endoscope was  passed under direct vision. Throughout the                            procedure, the patient's blood pressure, pulse, and                            oxygen saturations were monitored continuously. The                            Endoscope was introduced through the mouth, and                            advanced to the second part of duodenum. The upper                            GI endoscopy was accomplished without  difficulty.                            The patient tolerated the procedure well. Scope In: Scope Out: Findings:                 Patchy mildly erythematous mucosa without bleeding                            was found in the gastric antrum. Biopsies were                            taken with a cold forceps for histology.                            Verification of patient identification for the                            specimen was done. Estimated blood loss was minimal.                           A few diminutive sessile polyps were found in the                            gastric body. Biopsies were taken with a cold                            forceps for histology. Verification of patient                            identification for the specimen was done. Estimated                            blood loss was minimal.                           The gastroesophageal flap valve was visualized  endoscopically and classified as Hill Grade II                            (fold present, opens with respiration).                           The exam was otherwise without abnormality.                           The cardia and gastric fundus were otherwise normal                            on retroflexion. Complications:            No immediate complications. Estimated Blood Loss:     Estimated blood loss was minimal. Impression:               - Erythematous mucosa in the antrum. Biopsied.                           - A few gastric polyps. Biopsied.Look like innocent                            fundic gland polyps                           - Gastroesophageal flap valve classified as Hill                            Grade II (fold present, opens with respiration).                           - The examination was otherwise normal. Recommendation:           - Patient has a contact number available for                            emergencies. The signs and symptoms of potential                             delayed complications were discussed with the                            patient. Return to normal activities tomorrow.                            Written discharge instructions were provided to the                            patient.                           - Resume previous diet.                           - Continue present medications.                           -  See the other procedure note for documentation of                            additional recommendations. Lupita FORBES Commander, MD 11/02/2023 3:21:50 PM This report has been signed electronically.

## 2023-11-02 NOTE — Progress Notes (Signed)
 Called to room to assist during endoscopic procedure.  Patient ID and intended procedure confirmed with present staff. Received instructions for my participation in the procedure from the performing physician.

## 2023-11-02 NOTE — Progress Notes (Signed)
 Sedate, gd SR, tolerated procedure well, VSS, report to RN

## 2023-11-02 NOTE — Op Note (Signed)
 [facility] Patient Name: Billy Ferguson Procedure Date: 11/02/2023 2:41 PM MRN: [id number] Endoscopist: Lupita FORBES Commander , MD, [id number] Age: 53 Referring MD:  Date of Birth: 1970-08-30 Gender: Male Account #: [id number] Procedure:                Colonoscopy Indications:              Screening for colon cancer: Family history of                            colorectal cancer in paternal grandmother, Colon                            cancer screening in patient at increased risk:                            Family history of 1st-degree relative with colon                            polyps before age 32 years - father had adenomas at                            53 Medicines:                Monitored Anesthesia Care Procedure:                Pre-Anesthesia Assessment:                           - Prior to the procedure, a History and Physical                            was performed, and patient medications and                            allergies were reviewed. The patient's tolerance of                            previous anesthesia was also reviewed. The risks                            and benefits of the procedure and the sedation                            options and risks were discussed with the patient.                            All questions were answered, and informed consent                            was obtained. Prior Anticoagulants: The patient has                            taken no anticoagulant or antiplatelet agents. ASA  Grade Assessment: II - A patient with mild systemic                            disease. After reviewing the risks and benefits,                            the patient was deemed in satisfactory condition to                            undergo the procedure.                           After obtaining informed consent, the colonoscope                            was passed under direct vision. Throughout the                             procedure, the patient's blood pressure, pulse, and                            oxygen saturations were monitored continuously. The                            Olympus Scope SN: [id number] was introduced through                            the anus and advanced to the the terminal ileum,                            with identification of the appendiceal orifice and                            IC valve. The colonoscopy was performed without                            difficulty. The patient tolerated the procedure                            well. The quality of the bowel preparation was                            good. The ileocecal valve, appendiceal orifice, and                            rectum were photographed. The bowel preparation                            used was SUPREP via split dose instruction. Scope In: 2:59:37 PM Scope Out: 3:12:19 PM Scope Withdrawal Time: 0 hours 10 minutes 47 seconds  Total Procedure Duration: 0 hours 12 minutes 42 seconds  Findings:                 The perianal and digital rectal examinations  were                            normal. Pertinent negatives include normal prostate                            (size, shape, and consistency).                           A 4 mm polyp was found in the rectum distal rectum.                            The polyp was sessile. The polyp was removed with a                            cold snare. Resection and retrieval were complete.                            Verification of patient identification for the                            specimen was done. Estimated blood loss was minimal.                           Internal hemorrhoids were found.                           The terminal ileum appeared normal.                           The exam was otherwise without abnormality on                            direct and retroflexion views. Complications:            No immediate complications. Estimated Blood Loss:     Estimated  blood loss was minimal. Impression:               - One 4 mm polyp in the rectum in the distal                            rectum, removed with a cold snare. Resected and                            retrieved.                           - Internal hemorrhoids.                           - The examined portion of the ileum was normal.                           - The examination was otherwise normal on direct  and retroflexion views. Recommendation:           - Patient has a contact number available for                            emergencies. The signs and symptoms of potential                            delayed complications were discussed with the                            patient. Return to normal activities tomorrow.                            Written discharge instructions were provided to the                            patient.                           - Resume previous diet.                           - Continue present medications.                           - Repeat colonoscopy is recommended. The                            colonoscopy date will be determined after pathology                            results from today's exam become available for                            review. Lupita FORBES Commander, MD 11/02/2023 3:25:51 PM This report has been signed electronically.

## 2023-11-02 NOTE — Patient Instructions (Addendum)
 There was some erythema in the gastric antrum, patchy.  This is common and may not be anything but I biopsied to look for H. pylori and gastritis.  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  They are innocent and do not need any follow-up, I did biopsy them.  All else okay on the EGD.  Colonoscopy with a 4 mm rectal polyp that I removed, your hemorrhoids were little swollen which happens with a colonoscopy prep very often.  Otherwise normal into terminal ileum.  I will contact you with results and recommendations.  I appreciate the opportunity to care for you. Lupita CHARLENA Commander, MD, FACG  YOU HAD AN ENDOSCOPIC PROCEDURE TODAY AT THE [facility]:   Refer to the procedure report that was given to you for any specific questions about what was found during the examination.  If the procedure report does not answer your questions, please call your gastroenterologist to clarify.  If you requested that your care partner not be given the details of your procedure findings, then the procedure report has been included in a sealed envelope for you to review at your convenience later.  YOU SHOULD EXPECT: Some feelings of bloating in the abdomen. Passage of more gas than usual.  Walking can help get rid of the air that was put into your GI tract during the procedure and reduce the bloating. If you had a lower endoscopy (such as a colonoscopy or flexible sigmoidoscopy) you may notice spotting of blood in your stool or on the toilet paper. If you underwent a bowel prep for your procedure, you may not have a normal bowel movement for a few days.  Please Note:  You might notice some irritation and congestion in your nose or some drainage.  This is from the oxygen used during your procedure.  There is no need for concern and it should clear up in a day or so.  SYMPTOMS TO REPORT IMMEDIATELY:  Following lower  endoscopy (colonoscopy or flexible sigmoidoscopy):  Excessive amounts of blood in the stool  Significant tenderness or worsening of abdominal pains  Swelling of the abdomen that is new, acute  Fever of 100F or higher  Following upper endoscopy (EGD)  Vomiting of blood or coffee ground material  New chest pain or pain under the shoulder blades  Painful or persistently difficult swallowing  New shortness of breath  Fever of 100F or higher  Black, tarry-looking stools  For urgent or emergent issues, a gastroenterologist can be reached at any hour by calling [phone]. Do not use MyChart messaging for urgent concerns.    DIET:  We do recommend a small meal at first, but then you may proceed to your regular diet.  Drink plenty of fluids but you should avoid alcoholic beverages for 24 hours.  ACTIVITY:  You should plan to take it easy for the rest of today and you should NOT DRIVE or use heavy machinery until tomorrow (because of the sedation medicines used during the test).    FOLLOW UP: Our staff will call the number listed on your records the next business day following your procedure.  We will call around 7:15- 8:00 am to check on you and address any questions or concerns that you may have regarding the information given to you following your procedure. If we do not reach you, we will leave a message.  If any biopsies were taken you will be contacted by phone or by letter within the next 1-3 weeks.  Please call us  at [phone] if you have not heard about the biopsies in 3 weeks.    SIGNATURES/CONFIDENTIALITY: You and/or your care partner have signed paperwork which will be entered into your electronic medical record.  These signatures attest to the fact that that the information above on your After Visit Summary has been reviewed and is understood.  Full responsibility of the confidentiality of this discharge information lies with you and/or your care-partner.

## 2023-11-03 ENCOUNTER — Telehealth: Payer: Self-pay

## 2023-11-03 NOTE — Telephone Encounter (Signed)
  Follow up Call-     11/02/2023    2:19 PM  Call back number  Post procedure Call Back phone  # [phone]  Permission to leave phone message Yes     Patient questions:  Do you have a fever, pain , or abdominal swelling? No. Pain Score  0 *  Have you tolerated food without any problems? Yes.    Have you been able to return to your normal activities? Yes.    Do you have any questions about your discharge instructions: Diet   No. Medications  No. Follow up visit  No.  Do you have questions or concerns about your Care? No.  Actions: * If pain score is 4 or above: No action needed, pain <4.

## 2023-11-06 ENCOUNTER — Encounter: Payer: Self-pay | Admitting: Internal Medicine

## 2023-11-06 ENCOUNTER — Ambulatory Visit: Payer: Self-pay | Admitting: Internal Medicine

## 2023-11-06 DIAGNOSIS — Z860101 Personal history of adenomatous and serrated colon polyps: Secondary | ICD-10-CM | POA: Insufficient documentation

## 2023-11-06 LAB — SURGICAL PATHOLOGY
